# Patient Record
Sex: Male | Born: 1990 | Race: Black or African American | Hispanic: No | Marital: Single | Smoking: Current every day smoker
Health system: Southern US, Community
[De-identification: ages and names within clinical notes are randomized; demographics above are authoritative.]

---
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
## 2009-07-04 ENCOUNTER — Emergency Department (HOSPITAL_COMMUNITY): Admission: EM | Admit: 2009-07-04 | Discharge: 2009-07-04 | Payer: Self-pay | Admitting: Family Medicine

## 2010-06-08 ENCOUNTER — Emergency Department (HOSPITAL_COMMUNITY)
Admission: EM | Admit: 2010-06-08 | Discharge: 2010-06-08 | Disposition: A | Discharge status: Home / Self Care | Payer: Medicaid Other | Attending: Emergency Medicine | Admitting: Emergency Medicine

## 2010-06-08 DIAGNOSIS — L910 Hypertrophic scar: Secondary | ICD-10-CM | POA: Insufficient documentation

## 2010-06-08 DIAGNOSIS — H9209 Otalgia, unspecified ear: Secondary | ICD-10-CM | POA: Insufficient documentation

## 2010-12-28 LAB — URINALYSIS, ROUTINE W REFLEX MICROSCOPIC
Bilirubin Urine: NEGATIVE
Glucose, UA: NEGATIVE
Hgb urine dipstick: NEGATIVE
Ketones, ur: 15 — AB
Protein, ur: NEGATIVE
pH: 7

## 2010-12-28 LAB — HEPATIC FUNCTION PANEL
ALT: 28
Bilirubin, Direct: 0.3
Indirect Bilirubin: 0.8
Total Protein: 7.9

## 2010-12-28 LAB — I-STAT 8, (EC8 V) (CONVERTED LAB)
Acid-Base Excess: 1
Bicarbonate: 28.3 — ABNORMAL HIGH
Glucose, Bld: 113 — ABNORMAL HIGH
HCT: 52
Hemoglobin: 17.7 — ABNORMAL HIGH
Operator id: 151321
Potassium: 4.4
Sodium: 137
TCO2: 30

## 2010-12-28 LAB — CBC
HCT: 46.3
MCHC: 33
Platelets: 202
RDW: 13.4

## 2010-12-28 LAB — DIFFERENTIAL
Basophils Absolute: 0
Basophils Relative: 0
Eosinophils Relative: 0
Lymphocytes Relative: 6 — ABNORMAL LOW

## 2010-12-28 LAB — LIPASE, BLOOD: Lipase: 17

## 2010-12-28 LAB — INFLUENZA A+B VIRUS AG-DIRECT(RAPID): Influenza B Ag: NEGATIVE

## 2013-02-27 ENCOUNTER — Emergency Department (HOSPITAL_COMMUNITY)
Admission: EM | Admit: 2013-02-27 | Discharge: 2013-02-28 | Disposition: A | Discharge status: Home / Self Care | Payer: Self-pay | Attending: Emergency Medicine | Admitting: Emergency Medicine

## 2013-02-27 ENCOUNTER — Encounter (HOSPITAL_COMMUNITY): Payer: Self-pay | Admitting: Emergency Medicine

## 2013-02-27 DIAGNOSIS — Z79899 Other long term (current) drug therapy: Secondary | ICD-10-CM | POA: Insufficient documentation

## 2013-02-27 DIAGNOSIS — S41131A Puncture wound without foreign body of right upper arm, initial encounter: Secondary | ICD-10-CM

## 2013-02-27 DIAGNOSIS — Z792 Long term (current) use of antibiotics: Secondary | ICD-10-CM | POA: Insufficient documentation

## 2013-02-27 DIAGNOSIS — Z23 Encounter for immunization: Secondary | ICD-10-CM | POA: Insufficient documentation

## 2013-02-27 DIAGNOSIS — S41109A Unspecified open wound of unspecified upper arm, initial encounter: Secondary | ICD-10-CM | POA: Insufficient documentation

## 2013-02-27 DIAGNOSIS — F17200 Nicotine dependence, unspecified, uncomplicated: Secondary | ICD-10-CM | POA: Insufficient documentation
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
## 2013-02-28 ENCOUNTER — Emergency Department (HOSPITAL_COMMUNITY): Payer: Self-pay

## 2013-02-28 MED ORDER — LIDOCAINE-EPINEPHRINE 2 %-1:100000 IJ SOLN
10.0000 mL | Freq: Once | INTRAMUSCULAR | Status: AC
  Administered 2013-02-28: 10 mL
  Filled 2013-02-28: qty 10

## 2013-02-28 MED ORDER — CEPHALEXIN 250 MG PO CAPS
500.0000 mg | ORAL_CAPSULE | Freq: Once | ORAL | Status: AC
  Administered 2013-02-28: 500 mg via ORAL
  Filled 2013-02-28: qty 2

## 2013-02-28 MED ORDER — CEPHALEXIN 500 MG PO CAPS: 500.0000 mg | ORAL_CAPSULE | Freq: Three times a day (TID) | ORAL | Status: DC

## 2013-02-28 MED ORDER — TETANUS-DIPHTH-ACELL PERTUSSIS 5-2.5-18.5 LF-MCG/0.5 IM SUSP: 0.5000 mL | Freq: Once | INTRAMUSCULAR | Status: DC

## 2013-02-28 MED ORDER — OXYCODONE-ACETAMINOPHEN 5-325 MG PO TABS: 1.0000 | ORAL_TABLET | Freq: Once | ORAL | Status: DC

## 2013-02-28 MED ORDER — OXYCODONE-ACETAMINOPHEN 5-325 MG PO TABS
2.0000 | ORAL_TABLET | Freq: Once | ORAL | Status: AC
  Administered 2013-02-28: 2 via ORAL
  Filled 2013-02-28: qty 2

## 2013-02-28 MED ORDER — TETANUS-DIPHTH-ACELL PERTUSSIS 5-2.5-18.5 LF-MCG/0.5 IM SUSP
0.5000 mL | Freq: Once | INTRAMUSCULAR | Status: AC
  Administered 2013-02-28: 0.5 mL via INTRAMUSCULAR
  Filled 2013-02-28: qty 0.5

## 2013-02-28 NOTE — ED Notes (Signed)
EDNP at [facility] suturing.

## 2013-02-28 NOTE — ED Notes (Signed)
Pt ambulatory to br
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
## 2013-02-28 NOTE — ED Notes (Signed)
Alert, NAD, calm, waiting on xray, updated about wait.
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
## 2013-02-28 NOTE — ED Notes (Signed)
[employer] into room to speak with pt.

## 2013-03-01 NOTE — ED Provider Notes (Signed)
Medical screening examination/treatment/procedure(s) were performed by non-physician practitioner and as supervising physician I was immediately available for consultation/collaboration.    Gawain Crombie, MD 03/01/13 0026 

## 2013-03-30 ENCOUNTER — Emergency Department (HOSPITAL_COMMUNITY)
Admission: EM | Admit: 2013-03-30 | Discharge: 2013-03-30 | Disposition: A | Discharge status: Home / Self Care | Payer: PRIVATE HEALTH INSURANCE | Attending: Emergency Medicine | Admitting: Emergency Medicine

## 2013-03-30 ENCOUNTER — Encounter (HOSPITAL_COMMUNITY): Payer: Self-pay | Admitting: Emergency Medicine

## 2013-03-30 DIAGNOSIS — Z792 Long term (current) use of antibiotics: Secondary | ICD-10-CM | POA: Insufficient documentation

## 2013-03-30 DIAGNOSIS — L03319 Cellulitis of trunk, unspecified: Principal | ICD-10-CM

## 2013-03-30 DIAGNOSIS — R509 Fever, unspecified: Secondary | ICD-10-CM | POA: Insufficient documentation

## 2013-03-30 DIAGNOSIS — L02219 Cutaneous abscess of trunk, unspecified: Secondary | ICD-10-CM | POA: Insufficient documentation

## 2013-03-30 DIAGNOSIS — Z87891 Personal history of nicotine dependence: Secondary | ICD-10-CM | POA: Insufficient documentation

## 2013-03-30 DIAGNOSIS — L02214 Cutaneous abscess of groin: Secondary | ICD-10-CM

## 2013-03-30 MED ORDER — SULFAMETHOXAZOLE-TMP DS 800-160 MG PO TABS: 1.0000 | ORAL_TABLET | Freq: Two times a day (BID) | ORAL | Status: DC

## 2013-03-30 MED ORDER — HYDROCODONE-ACETAMINOPHEN 5-325 MG PO TABS: 2.0000 | ORAL_TABLET | Freq: Four times a day (QID) | ORAL | Status: DC | PRN

## 2013-03-30 MED ORDER — IBUPROFEN 200 MG PO TABS
600.0000 mg | ORAL_TABLET | Freq: Once | ORAL | Status: AC
  Administered 2013-03-30: 600 mg via ORAL
  Filled 2013-03-30: qty 3

## 2013-03-30 MED ORDER — SULFAMETHOXAZOLE-TMP DS 800-160 MG PO TABS
1.0000 | ORAL_TABLET | Freq: Once | ORAL | Status: AC
  Administered 2013-03-30: 1 via ORAL
  Filled 2013-03-30: qty 1

## 2013-03-30 NOTE — ED Notes (Signed)
Patient is alert and oriented x3.  He is complaining of a boil to the left side of his groin.   Currently he rates the pain 10 of 10.  He states that it has white drainage coming from  The area.

## 2013-03-30 NOTE — Discharge Instructions (Signed)
Abscess Care After An abscess (also called a boil or furuncle) is an infected area that contains a collection of pus. Signs and symptoms of an abscess include pain, tenderness, redness, or hardness, or you may feel a moveable soft area under your skin. An abscess can occur anywhere in the body. The infection may spread to surrounding tissues causing cellulitis. A cut (incision) by the surgeon was made over your abscess and the pus was drained out. Gauze may have been packed into the space to provide a drain that will allow the cavity to heal from the inside outwards. The boil may be painful for 5 to 7 days. Most people with a boil do not have high fevers. Your abscess, if seen early, may not have localized, and may not have been lanced. If not, another appointment may be required for this if it does not get better on its own or with medications. HOME CARE INSTRUCTIONS   Only take over-the-counter or prescription medicines for pain, discomfort, or fever as directed by your caregiver.  When you bathe, soak and then remove gauze or iodoform packs at least daily or as directed by your caregiver. You may then wash the wound gently with mild soapy water. Repack with gauze or do as your caregiver directs. SEEK IMMEDIATE MEDICAL CARE IF:   You develop increased pain, swelling, redness, drainage, or bleeding in the wound site.  You develop signs of generalized infection including muscle aches, chills, fever, or a general ill feeling.  An oral temperature above 102 F (38.9 C) develops, not controlled by medication. See your caregiver for a recheck if you develop any of the symptoms described above. If medications (antibiotics) were prescribed, take them as directed. Document Released: 09/27/2004 Document Revised: 06/03/2011 Document Reviewed: 05/25/2007 [facility] Patient Information 2014 [place], [place].  Cellulitis Cellulitis is an infection of the skin and the tissue under the skin. The infected area is  usually red and tender. This happens most often in the arms and lower legs. HOME CARE   Take your antibiotic medicine as told. Finish the medicine even if you start to feel better.  Keep the infected arm or leg raised (elevated).  Put a warm cloth on the area up to 4 times per day.  Only take medicines as told by your doctor.  Keep all doctor visits as told. GET HELP RIGHT AWAY IF:   You have a fever.  You feel very sleepy.  You throw up (vomit) or have watery poop (diarrhea).  You feel sick and have muscle aches and pains.  You see red streaks on the skin coming from the infected area.  Your red area gets bigger or turns a dark color.  Your bone or joint under the infected area is painful after the skin heals.  Your infection comes back in the same area or different area.  You have a puffy (swollen) bump in the infected area.  You have new symptoms. MAKE SURE YOU:   Understand these instructions.  Will watch your condition.  Will get help right away if you are not doing well or get worse. Document Released: 08/28/2007 Document Revised: 09/10/2011 Document Reviewed: 05/27/2011 [facility] Patient Information 2014 [place], [place]. Please take the antibiotic as directed.  Remove the packing in 2 days, and allow the skin to heal.  U. been given a prescription for pain control as well.  Return if he develops new or worsening symptoms.  Generalized body aches, or fever

## 2013-03-30 NOTE — ED Provider Notes (Signed)
CSN: [id number]     Arrival date & time 03/30/13  2025 History  This chart was scribed for non-physician practitioner, Earley Favor, FNP,working with Shanna Cisco, MD, by Karle Plumber, ED Scribe.  This patient was seen in room WTR7/WTR7 and the patient's care was started at 9:19 PM.  Chief Complaint  Patient presents with  . Recurrent Skin Infections   The history is provided by the patient. No language interpreter was used.   HPI Comments:  Thomas [name] is a 23 y.o. male who presents to the Emergency Department complaining of an abscess to his left groin that started approximately two days ago. He reports drainage and associated fever. Pt states the pain has been increasing. He reports taking Tylenol yesterday for the pain with mild relief.   History reviewed. No pertinent past medical history. History reviewed. No pertinent past surgical history. History reviewed. No pertinent family history. History  Substance Use Topics  . Smoking status: Former Games developer  . Smokeless tobacco: Not on file  . Alcohol Use: No    Review of Systems  Constitutional: Positive for fever.  Genitourinary: Negative for dysuria, penile swelling, scrotal swelling, penile pain and testicular pain.  Skin: Positive for wound.       Abscess to left groin  All other systems reviewed and are negative.    Allergies  Review of patient's allergies indicates no known allergies.  Home Medications   Current Outpatient Rx  Name  Route  Sig  Dispense  Refill  . acetaminophen (TYLENOL) 325 MG tablet   Oral   Take 650 mg by mouth every 6 (six) hours as needed (PAIN).         Marland Kitchen HYDROcodone-acetaminophen (NORCO/VICODIN) 5-325 MG per tablet   Oral   Take 2 tablets by mouth every 6 (six) hours as needed.   10 tablet   0   . sulfamethoxazole-trimethoprim (BACTRIM DS) 800-160 MG per tablet   Oral   Take 1 tablet by mouth 2 (two) times daily.   9 tablet   0    Triage Vitals: BP 141/93  Pulse 99   Temp(Src) 100.1 F (37.8 C) (Oral)  Resp 16  SpO2 96% Physical Exam  Nursing note and vitals reviewed. Constitutional: He is oriented to person, place, and time. He appears well-developed and well-nourished.  HENT:  Head: Normocephalic and atraumatic.  Eyes: EOM are normal.  Neck: Normal range of motion.  Cardiovascular: Normal rate.   Pulmonary/Chest: Effort normal.  Abdominal: Soft. He exhibits no distension. There is no tenderness.  Musculoskeletal: Normal range of motion.  Neurological: He is alert and oriented to person, place, and time.  Skin: Skin is warm and dry. No erythema.  5 x 4 cm abscess lateral side of mons. No induration. Does not extend to thigh.   Psychiatric: He has a normal mood and affect. His behavior is normal.    ED Course  Procedures (including critical care time) DIAGNOSTIC STUDIES: Oxygen Saturation is 96% on RA, normal by my interpretation.   COORDINATION OF CARE: 9:23 PM- Will I & D abscess. Will prescribe oral antibiotics and pain medications. Pt verbalizes understanding and agrees to plan.  INCISION AND DRAINAGE Performed by: Earley Favor, FNP Consent: Verbal consent obtained. Risks and benefits: risks, benefits and alternatives were discussed Type: abscess  Body area: left side of mons  Anesthesia: local infiltration  Incision was made with a scalpel.  Local anesthetic: lidocaine 1% with epinephrine  Anesthetic total: 5 ml  Complexity: complex Blunt  dissection to break up loculations  Drainage: purulent  Drainage amount: scant   Packing material: 1/4 in iodoform gauze  Patient tolerance: Patient tolerated the procedure well with no immediate complications.   Medications  sulfamethoxazole-trimethoprim (BACTRIM DS) 800-160 MG per tablet 1 tablet (not administered)  ibuprofen (ADVIL,MOTRIN) tablet 600 mg (not administered)    Labs Review Labs Reviewed - No data to display Imaging Review No results found.  EKG  Interpretation   None       MDM   1. Abscess of groin, left      I personally performed the services described in this documentation, which was scribed in my presence. The recorded information has been reviewed and is accurate.    Arman FilterGail K Suzie Vandam, NP 03/30/13 2149

## 2013-03-30 NOTE — ED Provider Notes (Signed)
Medical screening examination/treatment/procedure(s) were performed by non-physician practitioner and as supervising physician I was immediately available for consultation/collaboration.  Shanna CiscoMegan E Rosalee Tolley, MD 03/30/13 [phone]

## 2014-10-06 ENCOUNTER — Encounter (HOSPITAL_COMMUNITY): Payer: Self-pay | Admitting: Emergency Medicine

## 2014-10-06 ENCOUNTER — Emergency Department (HOSPITAL_COMMUNITY)
Admission: EM | Admit: 2014-10-06 | Discharge: 2014-10-06 | Disposition: A | Discharge status: Home / Self Care | Payer: PRIVATE HEALTH INSURANCE | Attending: Emergency Medicine | Admitting: Emergency Medicine

## 2014-10-06 DIAGNOSIS — S46812A Strain of other muscles, fascia and tendons at shoulder and upper arm level, left arm, initial encounter: Secondary | ICD-10-CM | POA: Insufficient documentation

## 2014-10-06 DIAGNOSIS — Y999 Unspecified external cause status: Secondary | ICD-10-CM | POA: Insufficient documentation

## 2014-10-06 DIAGNOSIS — M62830 Muscle spasm of back: Secondary | ICD-10-CM | POA: Insufficient documentation

## 2014-10-06 DIAGNOSIS — M25512 Pain in left shoulder: Secondary | ICD-10-CM

## 2014-10-06 DIAGNOSIS — Y92410 Unspecified street and highway as the place of occurrence of the external cause: Secondary | ICD-10-CM | POA: Insufficient documentation

## 2014-10-06 DIAGNOSIS — S299XXA Unspecified injury of thorax, initial encounter: Secondary | ICD-10-CM | POA: Insufficient documentation

## 2014-10-06 DIAGNOSIS — Y9389 Activity, other specified: Secondary | ICD-10-CM | POA: Insufficient documentation

## 2014-10-06 DIAGNOSIS — H9311 Tinnitus, right ear: Secondary | ICD-10-CM | POA: Insufficient documentation

## 2014-10-06 DIAGNOSIS — Z87891 Personal history of nicotine dependence: Secondary | ICD-10-CM | POA: Insufficient documentation

## 2014-10-06 DIAGNOSIS — M546 Pain in thoracic spine: Secondary | ICD-10-CM

## 2014-10-06 MED ORDER — NAPROXEN 500 MG PO TABS: 500.0000 mg | ORAL_TABLET | Freq: Two times a day (BID) | ORAL | Status: DC | PRN

## 2014-10-06 MED ORDER — CYCLOBENZAPRINE HCL 10 MG PO TABS: 10.0000 mg | ORAL_TABLET | Freq: Three times a day (TID) | ORAL | Status: DC | PRN
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
## 2015-06-19 ENCOUNTER — Emergency Department (HOSPITAL_COMMUNITY)
Admission: EM | Admit: 2015-06-19 | Discharge: 2015-06-19 | Disposition: A | Discharge status: Home / Self Care | Payer: No Typology Code available for payment source | Attending: Emergency Medicine | Admitting: Emergency Medicine

## 2015-06-19 ENCOUNTER — Encounter (HOSPITAL_COMMUNITY): Payer: Self-pay | Admitting: Emergency Medicine

## 2015-06-19 DIAGNOSIS — M545 Low back pain: Secondary | ICD-10-CM | POA: Insufficient documentation

## 2015-06-19 DIAGNOSIS — Z87828 Personal history of other (healed) physical injury and trauma: Secondary | ICD-10-CM | POA: Insufficient documentation

## 2015-06-19 DIAGNOSIS — M546 Pain in thoracic spine: Secondary | ICD-10-CM | POA: Insufficient documentation

## 2015-06-19 DIAGNOSIS — M549 Dorsalgia, unspecified: Secondary | ICD-10-CM

## 2015-06-19 DIAGNOSIS — Z87891 Personal history of nicotine dependence: Secondary | ICD-10-CM | POA: Insufficient documentation

## 2015-06-19 DIAGNOSIS — G8929 Other chronic pain: Secondary | ICD-10-CM | POA: Insufficient documentation

## 2015-06-19 MED ORDER — DEXAMETHASONE SODIUM PHOSPHATE 10 MG/ML IJ SOLN
10.0000 mg | Freq: Once | INTRAMUSCULAR | Status: AC
  Administered 2015-06-19: 10 mg via INTRAMUSCULAR
  Filled 2015-06-19: qty 1

## 2015-06-19 MED ORDER — KETOROLAC TROMETHAMINE 60 MG/2ML IM SOLN
60.0000 mg | Freq: Once | INTRAMUSCULAR | Status: AC
  Administered 2015-06-19: 60 mg via INTRAMUSCULAR
  Filled 2015-06-19: qty 2

## 2015-06-19 MED ORDER — CYCLOBENZAPRINE HCL 10 MG PO TABS: 10.0000 mg | ORAL_TABLET | Freq: Two times a day (BID) | ORAL | Status: DC | PRN

## 2015-06-19 MED ORDER — CYCLOBENZAPRINE HCL 10 MG PO TABS
10.0000 mg | ORAL_TABLET | Freq: Once | ORAL | Status: AC
  Administered 2015-06-19: 10 mg via ORAL
  Filled 2015-06-19: qty 1

## 2015-06-19 MED ORDER — TRAMADOL HCL 50 MG PO TABS: 50.0000 mg | ORAL_TABLET | Freq: Two times a day (BID) | ORAL | Status: DC | PRN

## 2015-06-19 MED ORDER — IBUPROFEN 800 MG PO TABS: 800.0000 mg | ORAL_TABLET | Freq: Three times a day (TID) | ORAL | Status: DC
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
## 2015-06-19 NOTE — Discharge Instructions (Signed)
Back Pain, Adult °Back pain is very common in adults. The cause of back pain is rarely dangerous and the pain often gets better over time. The cause of your back pain may not be known. Some common causes of back pain include: °· Strain of the muscles or ligaments supporting the spine. °· Wear and tear (degeneration) of the spinal disks. °· Arthritis. °· Direct injury to the back. °For many people, back pain may return. Since back pain is rarely dangerous, most people can learn to manage this condition on their own. °HOME CARE INSTRUCTIONS °Watch your back pain for any changes. The following actions may help to lessen any discomfort you are feeling: °· Remain active. It is stressful on your back to sit or stand in one place for long periods of time. Do not sit, drive, or stand in one place for more than 30 minutes at a time. Take short walks on even surfaces as soon as you are able. Try to increase the length of time you walk each day. °· Exercise regularly as directed by your health care provider. Exercise helps your back heal faster. It also helps avoid future injury by keeping your muscles strong and flexible. °· Do not stay in bed. Resting more than 1-2 days can delay your recovery. °· Pay attention to your body when you bend and lift. The most comfortable positions are those that put less stress on your recovering back. Always use proper lifting techniques, including: °· Bending your knees. °· Keeping the load close to your body. °· Avoiding twisting. °· Find a comfortable position to sleep. Use a firm mattress and lie on your side with your knees slightly bent. If you lie on your back, put a pillow under your knees. °· Avoid feeling anxious or stressed. Stress increases muscle tension and can worsen back pain. It is important to recognize when you are anxious or stressed and learn ways to manage it, such as with exercise. °· Take medicines only as directed by your health care provider. Over-the-counter  medicines to reduce pain and inflammation are often the most helpful. Your health care provider may prescribe muscle relaxant drugs. These medicines help dull your pain so you can more quickly return to your normal activities and healthy exercise. °· Apply ice to the injured area: °· Put ice in a plastic bag. °· Place a towel between your skin and the bag. °· Leave the ice on for 20 minutes, 2-3 times a day for the first 2-3 days. After that, ice and heat may be alternated to reduce pain and spasms. °· Maintain a healthy weight. Excess weight puts extra stress on your back and makes it difficult to maintain good posture. °SEEK MEDICAL CARE IF: °· You have pain that is not relieved with rest or medicine. °· You have increasing pain going down into the legs or buttocks. °· You have pain that does not improve in one week. °· You have night pain. °· You lose weight. °· You have a fever or chills. °SEEK IMMEDIATE MEDICAL CARE IF:  °· You develop new bowel or bladder control problems. °· You have unusual weakness or numbness in your arms or legs. °· You develop nausea or vomiting. °· You develop abdominal pain. °· You feel faint. °  °This information is not intended to replace advice given to you by your health care provider. Make sure you discuss any questions you have with your health care provider. °  °Document Released: 03/11/2005 Document Revised: 04/01/2014 Document Reviewed: 07/13/2013 °Elsevier Interactive Patient Education ©2016 [employer] ° °Chronic Back Pain ° When back pain lasts longer than 3 months, it is called chronic back pain. People with chronic back pain often go through certain periods that are more intense (flare-ups).  °CAUSES °Chronic back pain can be caused by wear and tear (degeneration) on different structures in your back. These structures include: °· The bones of your spine (vertebrae) and the joints surrounding your spinal cord and nerve roots (facets). °· The strong, fibrous tissues that  connect your vertebrae (ligaments). °Degeneration of these structures may result in pressure on your nerves. This can lead to constant pain. °HOME CARE INSTRUCTIONS °· Avoid bending, heavy lifting, prolonged sitting, and activities which make the problem worse. °· Take brief periods of rest throughout the day to reduce your pain. Lying down or standing usually is better than sitting while you are resting. °· Take over-the-counter or prescription medicines only as directed by your caregiver. °SEEK IMMEDIATE MEDICAL CARE IF:  °· You have weakness or numbness in one of your legs or feet. °· You have trouble controlling your bladder or bowels. °· You have nausea, vomiting, abdominal pain, shortness of breath, or fainting. °  °This information is not intended to replace advice given to you by your health care provider. Make sure you discuss any questions you have with your health care provider. °  °Document Released: 04/18/2004 Document Revised: 06/03/2011 Document Reviewed: 08/29/2014 °Elsevier Interactive Patient Education ©2016 [employer] Inc. ° °
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
## 2015-06-19 NOTE — ED Notes (Signed)
Bed: WA26 Expected date:  Expected time:  Means of arrival:  Comments: 

## 2015-09-04 ENCOUNTER — Emergency Department (HOSPITAL_COMMUNITY)
Admission: EM | Admit: 2015-09-04 | Discharge: 2015-09-04 | Disposition: A | Discharge status: Home / Self Care | Payer: PRIVATE HEALTH INSURANCE | Attending: Emergency Medicine | Admitting: Emergency Medicine

## 2015-09-04 ENCOUNTER — Encounter (HOSPITAL_COMMUNITY): Payer: Self-pay | Admitting: *Deleted

## 2015-09-04 DIAGNOSIS — N342 Other urethritis: Secondary | ICD-10-CM | POA: Diagnosis not present

## 2015-09-04 DIAGNOSIS — Z87891 Personal history of nicotine dependence: Secondary | ICD-10-CM | POA: Insufficient documentation

## 2015-09-04 DIAGNOSIS — A64 Unspecified sexually transmitted disease: Secondary | ICD-10-CM | POA: Diagnosis present

## 2015-09-04 LAB — URINALYSIS, ROUTINE W REFLEX MICROSCOPIC
BILIRUBIN URINE: NEGATIVE
GLUCOSE, UA: NEGATIVE mg/dL
HGB URINE DIPSTICK: NEGATIVE
Ketones, ur: 15 mg/dL — AB
Nitrite: NEGATIVE
PROTEIN: NEGATIVE mg/dL
SPECIFIC GRAVITY, URINE: 1.026 (ref 1.005–1.030)
pH: 6.5 (ref 5.0–8.0)

## 2015-09-04 LAB — URINE MICROSCOPIC-ADD ON: RBC / HPF: NONE SEEN RBC/hpf (ref 0–5)

## 2015-09-04 MED ORDER — CIPROFLOXACIN HCL 500 MG PO TABS: 500.0000 mg | ORAL_TABLET | Freq: Two times a day (BID) | ORAL | Status: DC

## 2015-09-04 MED ORDER — STERILE WATER FOR INJECTION IJ SOLN
INTRAMUSCULAR | Status: AC
  Administered 2015-09-04: 10 mL
  Filled 2015-09-04: qty 10

## 2015-09-04 MED ORDER — AZITHROMYCIN 250 MG PO TABS
1000.0000 mg | ORAL_TABLET | Freq: Once | ORAL | Status: AC
Start: 2015-09-04 — End: 2015-09-04
  Administered 2015-09-04: 1000 mg via ORAL
  Filled 2015-09-04: qty 4

## 2015-09-04 MED ORDER — AZITHROMYCIN 1 G PO PACK
1.0000 g | PACK | Freq: Once | ORAL | Status: DC
  Filled 2015-09-04: qty 1

## 2015-09-04 MED ORDER — CEFTRIAXONE SODIUM 250 MG IJ SOLR
250.0000 mg | Freq: Once | INTRAMUSCULAR | Status: AC
  Administered 2015-09-04: 250 mg via INTRAMUSCULAR
  Filled 2015-09-04: qty 250

## 2015-09-04 NOTE — ED Provider Notes (Signed)
History  By signing my name below, I, Earmon Phoenix, attest that this documentation has been prepared under the direction and in the presence of Teressa Lower, FNP. Electronically Signed: Earmon Phoenix, ED Scribe. 09/04/2015. 11:23 AM.  Chief Complaint  Patient presents with  . SEXUALLY TRANSMITTED DISEASE   The history is provided by the patient and medical records. No language interpreter was used.    HPI Comments:  Thomas [name] is a 25 y.o. male who presents to the Emergency Department complaining of white penile discharge that began 3-4 days ago. He reports having an STD in the past about two years ago.  He reports associated dysuria. He reports sexual activity without protection. He denies modifying factors of the symptoms. He denies abdominal pain, nausea, vomiting, fever, chills, penile or testicular swelling.  History reviewed. No pertinent past medical history. History reviewed. No pertinent past surgical history. History reviewed. No pertinent family history. Social History  Substance Use Topics  . Smoking status: Former Games developer  . Smokeless tobacco: None  . Alcohol Use: No    Review of Systems  Constitutional: Negative for fever and chills.  Gastrointestinal: Negative for nausea and vomiting.  Genitourinary: Positive for dysuria and discharge. Negative for penile swelling, penile pain and testicular pain.  All other systems reviewed and are negative.   Allergies  Review of patient's allergies indicates no known allergies.  Home Medications   Prior to Admission medications   Medication Sig Start Date End Date Taking? Authorizing Provider  acetaminophen (TYLENOL) 325 MG tablet Take 650 mg by mouth every 6 (six) hours as needed (PAIN).    Historical Provider, MD  cyclobenzaprine (FLEXERIL) 10 MG tablet Take 1 tablet (10 mg total) by mouth 2 (two) times daily as needed for muscle spasms. 06/19/15   Danelle Berry, PA-C  ibuprofen (ADVIL,MOTRIN) 800 MG tablet Take  1 tablet (800 mg total) by mouth 3 (three) times daily. 06/19/15   Danelle Berry, PA-C  naproxen (NAPROSYN) 500 MG tablet Take 1 tablet (500 mg total) by mouth 2 (two) times daily as needed for mild pain, moderate pain or headache (TAKE WITH MEALS.). 10/06/14   Mercedes Camprubi-Soms, PA-C  traMADol (ULTRAM) 50 MG tablet Take 1 tablet (50 mg total) by mouth every 12 (twelve) hours as needed for severe pain. 06/19/15   Danelle Berry, PA-C   Triage Vitals: BP 130/88 mmHg  Pulse 71  Temp(Src) 99 F (37.2 C) (Oral)  Resp 16  SpO2 100% Physical Exam  Constitutional: He is oriented to person, place, and time. He appears well-developed and well-nourished.  HENT:  Head: Normocephalic and atraumatic.  Eyes: EOM are normal.  Neck: Normal range of motion.  Cardiovascular: Normal rate.   Pulmonary/Chest: Effort normal.  Genitourinary: No penile tenderness.  No rash noted. Discharge noted  Musculoskeletal: Normal range of motion.  Neurological: He is alert and oriented to person, place, and time.  Skin: Skin is warm and dry.  Psychiatric: He has a normal mood and affect. His behavior is normal.  Nursing note and vitals reviewed.   ED Course  Procedures (including critical care time) DIAGNOSTIC STUDIES: Oxygen Saturation is 100% on RA, normal by my interpretation.   COORDINATION OF CARE: 11:18 AM- Will order STD panel and check and treat for GC/chlamydia. Pt verbalizes understanding and agrees to plan.  Medications - No data to display  Labs Review Labs Reviewed  URINALYSIS, ROUTINE W REFLEX MICROSCOPIC (NOT AT [facility])  HIV ANTIBODY (ROUTINE TESTING)  RPR  GC/CHLAMYDIA PROBE AMP ([facility]) NOT [facility]    Imaging Review No results found. I have personally reviewed and evaluated these images and lab results as part of my medical decision-making.   EKG Interpretation None      MDM   Final diagnoses:  Urethritis    Pt discharged after treatment. Educated on safe sex practices  I  personally performed the services described in this documentation, which was scribed in my presence. The recorded information has been reviewed and is accurate.     Teressa LowerVrinda Nikalas Bramel, NP 09/04/15 1154  Mancel BaleElliott Wentz, MD 09/04/15 [phone]

## 2015-09-04 NOTE — ED Notes (Signed)
Declined W/C at D/C and was escorted to lobby by RN. 

## 2015-09-04 NOTE — Discharge Instructions (Signed)
Urethritis, Adult °Urethritis is an inflammation of the tube through which urine exits your bladder (urethra).  °CAUSES °Urethritis is often caused by an infection in your urethra. The infection can be viral, like herpes. The infection can also be bacterial, like gonorrhea. °RISK FACTORS °Risk factors of urethritis include: °· Having sex without using a condom. °· Having multiple sexual partners. °· Having poor hygiene. °SIGNS AND SYMPTOMS °Symptoms of urethritis are less noticeable in women than in men. These symptoms include: °· Burning feeling when you urinate (dysuria). °· Discharge from your urethra. °· Blood in your urine (hematuria). °· Urinating more than usual. °DIAGNOSIS  °To confirm a diagnosis of urethritis, your health care provider will do the following: °· Ask about your sexual history. °· Perform a physical exam. °· Have you provide a sample of your urine for lab testing. °· Use a cotton swab to gently collect a sample from your urethra for lab testing. °TREATMENT  °It is important to treat urethritis. Depending on the cause, untreated urethritis may lead to serious genital infections and possibly infertility. Urethritis caused by a bacterial infection is treated with antibiotic medicine. All sexual partners must be treated.  °HOME CARE INSTRUCTIONS °· Do not have sex until the test results are known and treatment is completed, even if your symptoms go away before you finish treatment. °· If you were prescribed an antibiotic, finish it all even if you start to feel better. °SEEK MEDICAL CARE IF:  °· Your symptoms are not improved in 3 days. °· Your symptoms are getting worse. °· You develop abdominal pain or pelvic pain (in women). °· You develop joint pain. °· You have a fever. °SEEK IMMEDIATE MEDICAL CARE IF:  °· You have severe pain in the belly, back, or side. °· You have repeated vomiting. °MAKE SURE YOU: °· Understand these instructions. °· Will watch your condition. °· Will get help right away  if you are not doing well or get worse. °  °This information is not intended to replace advice given to you by your health care provider. Make sure you discuss any questions you have with your health care provider. °  °Document Released: 09/04/2000 Document Revised: 07/26/2014 Document Reviewed: 11/09/2012 °Elsevier Interactive Patient Education ©2016 [employer] Inc. ° °

## 2015-09-04 NOTE — ED Notes (Signed)
Pt reports drainage from penis . ?

## 2015-09-05 LAB — RPR: RPR: NONREACTIVE

## 2015-09-05 LAB — HIV ANTIBODY (ROUTINE TESTING W REFLEX): HIV SCREEN 4TH GENERATION: NONREACTIVE

## 2015-09-05 LAB — GC/CHLAMYDIA PROBE AMP (~~LOC~~) NOT AT ARMC
CHLAMYDIA, DNA PROBE: NEGATIVE
NEISSERIA GONORRHEA: NEGATIVE

## 2016-05-20 ENCOUNTER — Encounter (HOSPITAL_COMMUNITY): Payer: Self-pay

## 2016-05-20 ENCOUNTER — Emergency Department (HOSPITAL_COMMUNITY)
Admission: EM | Admit: 2016-05-20 | Discharge: 2016-05-20 | Disposition: A | Discharge status: Home / Self Care | Payer: PRIVATE HEALTH INSURANCE | Attending: Emergency Medicine | Admitting: Emergency Medicine

## 2016-05-20 DIAGNOSIS — Z87891 Personal history of nicotine dependence: Secondary | ICD-10-CM | POA: Insufficient documentation

## 2016-05-20 DIAGNOSIS — R369 Urethral discharge, unspecified: Secondary | ICD-10-CM

## 2016-05-20 MED ORDER — LIDOCAINE HCL (PF) 1 % IJ SOLN
INTRAMUSCULAR | Status: AC
  Administered 2016-05-20: 5 mL
  Filled 2016-05-20: qty 5

## 2016-05-20 MED ORDER — CEFTRIAXONE SODIUM 250 MG IJ SOLR
250.0000 mg | Freq: Once | INTRAMUSCULAR | Status: AC
  Administered 2016-05-20: 250 mg via INTRAMUSCULAR
  Filled 2016-05-20: qty 250

## 2016-05-20 MED ORDER — AZITHROMYCIN 250 MG PO TABS
1000.0000 mg | ORAL_TABLET | Freq: Once | ORAL | Status: AC
  Administered 2016-05-20: 1000 mg via ORAL
  Filled 2016-05-20: qty 4

## 2016-05-20 NOTE — ED Provider Notes (Signed)
[facility] Provider Note   CSN: [id number] Arrival date & time: 05/20/16  1140  By signing my name below, I, Marnette Burgess Long, attest that this documentation has been prepared under the direction and in the presence of Cathren Laine, MD . Electronically Signed: Marnette Burgess Long, Scribe. 05/20/2016. 1:09 PM.    History   Chief Complaint No chief complaint on file.   The history is provided by the patient. No language interpreter was used.    HPI Comments:  Thomas [name] is a 26 y.o. male who presents to the Emergency Department complaining of mild, white, penile discharge with moderate penile pain onset this morning. He reports waking up this morning and noticing the moderate penile pain with discharge from penile meatus. He has associated symptoms of dysuria. He reports he is sexually active with one partner but has not had intercourse in over a month. He did not take anything PTA for relief of his symptoms. Direct pressure and palpation exacerbate his pain. Pt denies abdominal pain, vomiting, fever, CP, and any other associated symptoms at this time.   History reviewed. No pertinent past medical history.  There are no active problems to display for this patient.   History reviewed. No pertinent surgical history.     Home Medications    Prior to Admission medications   Medication Sig Start Date End Date Taking? Authorizing Provider  acetaminophen (TYLENOL) 325 MG tablet Take 650 mg by mouth every 6 (six) hours as needed (PAIN).    Historical Provider, MD  ciprofloxacin (CIPRO) 500 MG tablet Take 1 tablet (500 mg total) by mouth 2 (two) times daily. 09/04/15   Teressa Lower, NP  cyclobenzaprine (FLEXERIL) 10 MG tablet Take 1 tablet (10 mg total) by mouth 2 (two) times daily as needed for muscle spasms. 06/19/15   Danelle Berry, PA-C  ibuprofen (ADVIL,MOTRIN) 800 MG tablet Take 1 tablet (800 mg total) by mouth 3 (three) times daily. 06/19/15   Danelle Berry, PA-C  naproxen  (NAPROSYN) 500 MG tablet Take 1 tablet (500 mg total) by mouth 2 (two) times daily as needed for mild pain, moderate pain or headache (TAKE WITH MEALS.). 10/06/14   Mercedes Street, PA-C  traMADol (ULTRAM) 50 MG tablet Take 1 tablet (50 mg total) by mouth every 12 (twelve) hours as needed for severe pain. 06/19/15   Danelle Berry, PA-C    Family History No family history on file.  Social History Social History  Substance Use Topics  . Smoking status: Former Games developer  . Smokeless tobacco: Not on file  . Alcohol use No     Allergies   Patient has no known allergies.   Review of Systems Review of Systems  Constitutional: Negative for fever.  Cardiovascular: Negative for chest pain.  Gastrointestinal: Negative for abdominal pain and vomiting.  Genitourinary: Positive for discharge, dysuria and penile pain.     Physical Exam Updated Vital Signs BP 158/100   Pulse 74   Temp 98.1 F (36.7 C)   Resp 18   SpO2 100%   Physical Exam  Constitutional: He appears well-developed and well-nourished.  HENT:  Head: Normocephalic.  Eyes: Conjunctivae are normal.  Cardiovascular: Normal rate.   Pulmonary/Chest: Effort normal.  Abdominal: He exhibits no distension.  Genitourinary:  Genitourinary Comments: Mild whitish penile discharge   Musculoskeletal: Normal range of motion.  Neurological: He is alert.  Skin: Skin is warm and dry.  Psychiatric: He has a normal mood and affect.  Nursing note and vitals reviewed.  ED Treatments / Results  DIAGNOSTIC STUDIES:  Oxygen Saturation is 100% on RA, normal by my interpretation.    COORDINATION OF CARE:  12:59 PM Discussed treatment plan with pt at bedside including UA and penile swab and pt agreed to plan.  Labs (all labs ordered are listed, but only abnormal results are displayed) Labs Reviewed - No data to display  EKG  EKG Interpretation None       Radiology No results found.  Procedures Procedures (including  critical care time)  Medications Ordered in ED Medications - No data to display   Initial Impression / Assessment and Plan / ED Course  I have reviewed the triage vital signs and the nursing notes.  Pertinent labs & imaging results that were available during my care of the patient were reviewed by me and considered in my medical decision making (see chart for details).  Confirmed nkda w pt.   Urethral swab sent.  Rocephin and zithromax rx given.    Final Clinical Impressions(s) / ED Diagnoses   Final diagnoses:  None    New Prescriptions New Prescriptions   No medications on file   I personally performed the services described in this documentation, which was scribed in my presence. The recorded information has been reviewed and considered. Cathren LaineKevin Roland Prine, MD     Cathren LaineKevin Yedidya Duddy, MD 05/20/16 [phone]

## 2016-05-20 NOTE — Discharge Instructions (Signed)
It was our pleasure to provide your ER care today - we hope that you feel better.  No sex until after symptoms have resolved, then use condoms for safer sex.   Have any sexual contacts checked by their doctor or county health department std clinic.   Return to ER if worse, new symptoms, fevers, other concern.

## 2016-05-20 NOTE — ED Triage Notes (Signed)
Patient complains of penile pain and discharge x 1 day

## 2016-05-21 LAB — GC/CHLAMYDIA PROBE AMP (~~LOC~~) NOT AT ARMC
Chlamydia: NEGATIVE
Neisseria Gonorrhea: POSITIVE — AB

## 2016-09-28 ENCOUNTER — Encounter (HOSPITAL_COMMUNITY): Payer: Self-pay

## 2016-09-28 ENCOUNTER — Emergency Department (HOSPITAL_COMMUNITY)
Admission: EM | Admit: 2016-09-28 | Discharge: 2016-09-28 | Disposition: A | Discharge status: Home / Self Care | Payer: PRIVATE HEALTH INSURANCE | Attending: Emergency Medicine | Admitting: Emergency Medicine

## 2016-09-28 DIAGNOSIS — Z79899 Other long term (current) drug therapy: Secondary | ICD-10-CM | POA: Insufficient documentation

## 2016-09-28 DIAGNOSIS — Z791 Long term (current) use of non-steroidal anti-inflammatories (NSAID): Secondary | ICD-10-CM | POA: Insufficient documentation

## 2016-09-28 DIAGNOSIS — Z87891 Personal history of nicotine dependence: Secondary | ICD-10-CM | POA: Insufficient documentation

## 2016-09-28 DIAGNOSIS — M62830 Muscle spasm of back: Secondary | ICD-10-CM | POA: Insufficient documentation

## 2016-09-28 MED ORDER — CYCLOBENZAPRINE HCL 10 MG PO TABS: 10.0000 mg | ORAL_TABLET | Freq: Every evening | ORAL | 0 refills | Status: DC | PRN

## 2016-09-28 NOTE — ED Notes (Signed)
Pt woke this am with mid to lower back pain. States was unable to go to work. States has had "back problems" on and off since he worked for [employer]. Ambulates w/o difficulty.

## 2016-09-28 NOTE — Discharge Instructions (Signed)
Please read instructions below.  Talk with your PCP about any new medications.  You can take advil or aleve as needed for pain. You can take flexeril at bedtime for muscle spasm. Do not drive or drink alcohol while taking this medication. Schedule an appointment with the orthopedic specialist for follow-up on your ongoing pain.  Return to ER if new numbness or tingling in your arms or legs, inability to urinate, inability to hold your bowels, or weakness in your extremities.

## 2016-09-28 NOTE — ED Notes (Signed)
Pt reports he was supposed to be at work this morning but couldn't get out of the bed due to his chronic back pain that he has been "having for years" and states he wants to know what is wrong with his back. Ambulatory, NAD.

## 2016-09-28 NOTE — ED Provider Notes (Signed)
[facility] Provider Note   CSN: [id number] Arrival date & time: 09/28/16  1131  By signing my name below, I, Deland Pretty, attest that this documentation has been prepared under the direction and in the presence of [place] Russo, PA-C Electronically Signed: Deland Pretty, ED Scribe. 09/28/16. 1:58 PM.  History   Chief Complaint Chief Complaint  Patient presents with  . Back Pain   The history is provided by the patient. No language interpreter was used.   HPI Comments: Thomas [name] is a 26 y.o. male who presents to the Emergency Department complaining of chronic mid back pain that worsened this morning. Patient states he attempted to get out of bed this morning, and was having significant pain in his mid back. No new injuries. Denies modifying factors, has not tried ice or heat, or pain medication. He states that 6 years ago he tried to lift a heavy TV causing the onset of his symptoms. He has not seen an orthopedist or a PCP for his pain. He reports his job job requires him to do heavy lifting, which aggravates his symptoms. Denies history of IV drug use, cancer, fever, urinary symptoms, bowel or bladder incontinence, numbness or tingling down extremities.   History reviewed. No pertinent past medical history.  There are no active problems to display for this patient.   History reviewed. No pertinent surgical history.     Home Medications    Prior to Admission medications   Medication Sig Start Date End Date Taking? Authorizing Provider  acetaminophen (TYLENOL) 325 MG tablet Take 650 mg by mouth every 6 (six) hours as needed (PAIN).    [provider]  ciprofloxacin (CIPRO) 500 MG tablet Take 1 tablet (500 mg total) by mouth 2 (two) times daily. 09/04/15   Teressa Lower, NP  cyclobenzaprine (FLEXERIL) 10 MG tablet Take 1 tablet (10 mg total) by mouth at bedtime as needed for muscle spasms. 09/28/16   Russo, [place] N, PA-C  ibuprofen (ADVIL,MOTRIN) 800 MG  tablet Take 1 tablet (800 mg total) by mouth 3 (three) times daily. 06/19/15   Danelle Berry, PA-C  naproxen (NAPROSYN) 500 MG tablet Take 1 tablet (500 mg total) by mouth 2 (two) times daily as needed for mild pain, moderate pain or headache (TAKE WITH MEALS.). 10/06/14   Street, [place], PA-C  traMADol (ULTRAM) 50 MG tablet Take 1 tablet (50 mg total) by mouth every 12 (twelve) hours as needed for severe pain. 06/19/15   Danelle Berry, PA-C    Family History No family history on file.  Social History Social History  Substance Use Topics  . Smoking status: Former Games developer  . Smokeless tobacco: Never Used  . Alcohol use No     Allergies   Patient has no known allergies.   Review of Systems Review of Systems  Constitutional: Negative for fever.  Gastrointestinal:       No bowel incontinence.  Genitourinary: Negative for difficulty urinating, dysuria and frequency.  Musculoskeletal: Positive for back pain.  Neurological: Negative for numbness.     Physical Exam Updated Vital Signs BP (!) 143/97 (BP Location: Left Arm)   Pulse 64   Temp 98.5 F (36.9 C) (Oral)   Resp 18   SpO2 100%   Physical Exam  Constitutional: He appears well-developed and well-nourished. No distress.  HENT:  Head: Normocephalic and atraumatic.  Eyes: Conjunctivae are normal.  Neck: Normal range of motion.  Cardiovascular: Normal rate.   Pulmonary/Chest: Effort normal.  Musculoskeletal: He exhibits tenderness.  Bilateral thoracic paraspinal musculature tenderness. No spinal tenderness. No bony step-offs. No gross deformities. Moving all extremities.  Neurological: He displays normal reflexes. He exhibits normal muscle tone. Coordination normal.  5/5 Strength bilateral upper and lower extremities. Normal sensation. Normal gait.   Psychiatric: He has a normal mood and affect. His behavior is normal.  Nursing note and vitals reviewed.    ED Treatments / Results   DIAGNOSTIC STUDIES: Oxygen  Saturation is 100% on RA, normal by my interpretation.   COORDINATION OF CARE: 1:45 PM-Discussed next steps with pt. Pt verbalized understanding and is agreeable with the plan.   Labs (all labs ordered are listed, but only abnormal results are displayed) Labs Reviewed - No data to display  EKG  EKG Interpretation None       Radiology No results found.  Procedures Procedures (including critical care time)  Medications Ordered in ED Medications - No data to display   Initial Impression / Assessment and Plan / ED Course  I have reviewed the triage vital signs and the nursing notes.  Pertinent labs & imaging results that were available during my care of the patient were reviewed by me and considered in my medical decision making (see chart for details).    Patient with chronic back pain.  No neurological deficits and normal neuro exam.  Patient can walk but states is painful.  No loss of bowel or bladder control.  No concern for cauda equina.  No fever, night sweats, weight loss, h/o cancer, IVDU.  RICE protocol and pain medicine indicated and discussed with patient. Orthopedic referral given. Patient is safe for discharge home.  Discussed results, findings, treatment and follow up. Patient advised of return precautions. Patient verbalized understanding and agreed with plan.  Final Clinical Impressions(s) / ED Diagnoses   Final diagnoses:  Muscle spasm of back    New Prescriptions New Prescriptions   CYCLOBENZAPRINE (FLEXERIL) 10 MG TABLET    Take 1 tablet (10 mg total) by mouth at bedtime as needed for muscle spasms.   I personally performed the services described in this documentation, which was scribed in my presence. The recorded information has been reviewed and is accurate.     Russo, [place] N, PA-C 09/28/16 1408    Benjiman CorePickering, Nathan, MD 09/28/16 [phone]

## 2016-12-01 ENCOUNTER — Ambulatory Visit (HOSPITAL_COMMUNITY)
Admission: EM | Admit: 2016-12-01 | Discharge: 2016-12-01 | Disposition: A | Discharge status: Home / Self Care | Payer: Self-pay | Attending: Emergency Medicine | Admitting: Emergency Medicine

## 2016-12-01 ENCOUNTER — Encounter (HOSPITAL_COMMUNITY): Payer: Self-pay | Admitting: *Deleted

## 2016-12-01 DIAGNOSIS — Z113 Encounter for screening for infections with a predominantly sexual mode of transmission: Secondary | ICD-10-CM

## 2016-12-01 DIAGNOSIS — Z202 Contact with and (suspected) exposure to infections with a predominantly sexual mode of transmission: Secondary | ICD-10-CM | POA: Insufficient documentation

## 2016-12-01 DIAGNOSIS — F17210 Nicotine dependence, cigarettes, uncomplicated: Secondary | ICD-10-CM | POA: Insufficient documentation

## 2016-12-01 DIAGNOSIS — Z7253 High risk bisexual behavior: Secondary | ICD-10-CM

## 2016-12-01 NOTE — ED Triage Notes (Signed)
Denies any sxs.  Requesting STD testing for unprotected sex couple days ago.

## 2016-12-01 NOTE — Discharge Instructions (Signed)
We will notify you if your results are positive.  Use a condom for every sexual encounter to prevent sexually transmitted diseases.

## 2016-12-01 NOTE — ED Provider Notes (Signed)
[facility]    CSN: [id number] Arrival date & time: 12/01/16  1841    History   Chief Complaint Chief Complaint  Patient presents with  . Unprotected Sex    HPI   Thomas [name] is a 26 y.o. male presenting tonight with concerns that he might have a sexual transmitted infection. Patient states his most recent sexual partner reported she had HPV. He is concerned as he did not use condoms. Denies any symptoms or issues. Just wants STD check.     History reviewed. No pertinent past medical history.  There are no active problems to display for this patient.   History reviewed. No pertinent surgical history.     Home Medications    Prior to Admission medications   Medication Sig Start Date End Date Taking? Authorizing Provider  acetaminophen (TYLENOL) 325 MG tablet Take 650 mg by mouth every 6 (six) hours as needed (PAIN).    [provider]  ciprofloxacin (CIPRO) 500 MG tablet Take 1 tablet (500 mg total) by mouth 2 (two) times daily. 09/04/15   Teressa LowerPickering, Vrinda, NP  cyclobenzaprine (FLEXERIL) 10 MG tablet Take 1 tablet (10 mg total) by mouth at bedtime as needed for muscle spasms. 09/28/16   Russo, [place] N, PA-C  ibuprofen (ADVIL,MOTRIN) 800 MG tablet Take 1 tablet (800 mg total) by mouth 3 (three) times daily. 06/19/15   Danelle Berryapia, Leisa, PA-C  naproxen (NAPROSYN) 500 MG tablet Take 1 tablet (500 mg total) by mouth 2 (two) times daily as needed for mild pain, moderate pain or headache (TAKE WITH MEALS.). 10/06/14   Street, [place], PA-C  traMADol (ULTRAM) 50 MG tablet Take 1 tablet (50 mg total) by mouth every 12 (twelve) hours as needed for severe pain. 06/19/15   Danelle Berryapia, Leisa, PA-C    Family History No family history on file.  Social History Social History  Substance Use Topics  . Smoking status: Current Every Day Smoker    Types: Cigarettes  . Smokeless tobacco: Never Used  . Alcohol use No     Allergies   Patient has no known allergies.   Review  of Systems Review of Systems  Constitutional: Negative.  Negative for fatigue and fever.  HENT: Negative.   Eyes: Negative.   Respiratory: Negative.  Negative for cough and shortness of breath.   Cardiovascular: Negative.   Gastrointestinal: Negative.  Negative for diarrhea, nausea and vomiting.  Endocrine: Negative.   Genitourinary: Negative.   Musculoskeletal: Negative.   Skin: Negative.  Negative for rash.  Allergic/Immunologic: Negative.   Neurological: Negative.  Negative for headaches.  Hematological: Negative.   Psychiatric/Behavioral: Negative.      Physical Exam Triage Vital Signs ED Triage Vitals [12/01/16 1944]  Enc Vitals Group     BP (!) 149/93     Pulse Rate 77     Resp 16     Temp 98.5 F (36.9 C)     Temp Source Oral     SpO2 100 %     Weight      Height      Head Circumference      Peak Flow      Pain Score      Pain Loc      Pain Edu?      Excl. in GC?    No data found.   Updated Vital Signs BP (!) 149/93   Pulse 77   Temp 98.5 F (36.9 C) (Oral)   Resp 16   SpO2 100%  Physical Exam  Constitutional: He appears well-developed and well-nourished.  HENT:  Head: Normocephalic and atraumatic.  Eyes: Conjunctivae are normal.  Cardiovascular: Normal rate and regular rhythm.   No murmur heard. Pulmonary/Chest: Effort normal and breath sounds normal. No respiratory distress.  Abdominal: Soft. There is no tenderness.  Musculoskeletal: He exhibits no edema.  Neurological: He is alert.  Skin: Skin is warm and dry.  Psychiatric: He has a normal mood and affect.  Nursing note and vitals reviewed.  The patient denies any generalized malaise, fever, chills, eye problems, sore throat, skin rash or joint pain.  Denies abdominal pain, nausea, or vomiting. In addition, the patient denies any dysuria, burning, testicular pain or swelling, lesions of the penis or penile discharge.  UC Treatments / Results  Labs (all labs ordered are listed, but  only abnormal results are displayed) Labs Reviewed  HIV ANTIBODY (ROUTINE TESTING)  RPR  URINE CYTOLOGY ANCILLARY ONLY   Labwork is pending.   EKG  EKG Interpretation None       Radiology No results found.  Procedures Procedures (including critical care time)  Medications Ordered in UC Medications - No data to display   Initial Impression / Assessment and Plan / UC Course  I have reviewed the triage vital signs and the nursing notes.  Pertinent labs & imaging results that were available during my care of the patient were reviewed by me and considered in my medical decision making (see chart for details).    Final Clinical Impressions(s) / UC Diagnoses   Final diagnoses:  High risk bisexual behavior    New Prescriptions Discharge Medication List as of 12/01/2016  8:43 PM     Discussed safe sexual practices. An prevalence of HPV and other sexual transmitted infections with her community. The usual and customary discharge instructions and warnings were given.  The patient verbalizes understanding and agrees to plan of care.     Controlled Substance Prescriptions [place] Controlled Substance Registry consulted? Not Applicable   Servando Salina, NP 12/01/16 2155

## 2016-12-02 LAB — HIV ANTIBODY (ROUTINE TESTING W REFLEX): HIV Screen 4th Generation wRfx: NONREACTIVE

## 2016-12-02 LAB — RPR: RPR: NONREACTIVE

## 2016-12-02 LAB — URINE CYTOLOGY ANCILLARY ONLY
Chlamydia: NEGATIVE
Neisseria Gonorrhea: NEGATIVE

## 2017-01-30 ENCOUNTER — Emergency Department (HOSPITAL_COMMUNITY)
Admission: EM | Admit: 2017-01-30 | Discharge: 2017-01-30 | Disposition: A | Discharge status: Home / Self Care | Payer: Self-pay | Attending: Emergency Medicine | Admitting: Emergency Medicine

## 2017-01-30 DIAGNOSIS — Z5321 Procedure and treatment not carried out due to patient leaving prior to being seen by health care provider: Secondary | ICD-10-CM | POA: Insufficient documentation

## 2017-01-30 DIAGNOSIS — R197 Diarrhea, unspecified: Secondary | ICD-10-CM | POA: Insufficient documentation

## 2017-01-30 LAB — COMPREHENSIVE METABOLIC PANEL
ALT: 20 U/L (ref 17–63)
ANION GAP: 5 (ref 5–15)
AST: 25 U/L (ref 15–41)
Albumin: 3.4 g/dL — ABNORMAL LOW (ref 3.5–5.0)
Alkaline Phosphatase: 47 U/L (ref 38–126)
BUN: 7 mg/dL (ref 6–20)
CHLORIDE: 109 mmol/L (ref 101–111)
CO2: 25 mmol/L (ref 22–32)
Calcium: 8.3 mg/dL — ABNORMAL LOW (ref 8.9–10.3)
Creatinine, Ser: 1.07 mg/dL (ref 0.61–1.24)
Glucose, Bld: 108 mg/dL — ABNORMAL HIGH (ref 65–99)
POTASSIUM: 3.8 mmol/L (ref 3.5–5.1)
SODIUM: 139 mmol/L (ref 135–145)
Total Bilirubin: 0.7 mg/dL (ref 0.3–1.2)
Total Protein: 5.3 g/dL — ABNORMAL LOW (ref 6.5–8.1)

## 2017-01-30 LAB — URINALYSIS, ROUTINE W REFLEX MICROSCOPIC
BILIRUBIN URINE: NEGATIVE
Glucose, UA: NEGATIVE mg/dL
Hgb urine dipstick: NEGATIVE
KETONES UR: NEGATIVE mg/dL
Nitrite: NEGATIVE
PH: 7 (ref 5.0–8.0)
PROTEIN: NEGATIVE mg/dL
Specific Gravity, Urine: 1.019 (ref 1.005–1.030)

## 2017-01-30 LAB — CBC
HEMATOCRIT: 42.7 % (ref 39.0–52.0)
HEMOGLOBIN: 14.6 g/dL (ref 13.0–17.0)
MCH: 28.8 pg (ref 26.0–34.0)
MCHC: 34.2 g/dL (ref 30.0–36.0)
MCV: 84.2 fL (ref 78.0–100.0)
PLATELETS: 162 10*3/uL (ref 150–400)
RBC: 5.07 MIL/uL (ref 4.22–5.81)
RDW: 13.2 % (ref 11.5–15.5)
WBC: 5.2 10*3/uL (ref 4.0–10.5)

## 2017-01-30 LAB — LIPASE, BLOOD: LIPASE: 28 U/L (ref 11–51)

## 2017-01-30 MED ORDER — ONDANSETRON 4 MG PO TBDP: 4.0000 mg | ORAL_TABLET | Freq: Once | ORAL | Status: DC | PRN

## 2017-01-30 NOTE — ED Notes (Signed)
Second call in lobby for vitals update. No response. 
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
## 2017-01-30 NOTE — ED Notes (Signed)
Pt called in lobby for vitals update. No response. 

## 2017-10-01 ENCOUNTER — Emergency Department (HOSPITAL_COMMUNITY): Payer: Self-pay

## 2017-10-01 ENCOUNTER — Other Ambulatory Visit: Payer: Self-pay

## 2017-10-01 ENCOUNTER — Emergency Department (HOSPITAL_COMMUNITY)
Admission: EM | Admit: 2017-10-01 | Discharge: 2017-10-01 | Disposition: A | Discharge status: Home / Self Care | Payer: Self-pay | Attending: Emergency Medicine | Admitting: Emergency Medicine

## 2017-10-01 ENCOUNTER — Encounter (HOSPITAL_COMMUNITY): Payer: Self-pay | Admitting: Emergency Medicine

## 2017-10-01 DIAGNOSIS — S0280XA Fracture of other specified skull and facial bones, unspecified side, initial encounter for closed fracture: Secondary | ICD-10-CM

## 2017-10-01 DIAGNOSIS — F17210 Nicotine dependence, cigarettes, uncomplicated: Secondary | ICD-10-CM | POA: Insufficient documentation

## 2017-10-01 DIAGNOSIS — S0285XA Fracture of orbit, unspecified, initial encounter for closed fracture: Secondary | ICD-10-CM

## 2017-10-01 DIAGNOSIS — R55 Syncope and collapse: Secondary | ICD-10-CM | POA: Insufficient documentation

## 2017-10-01 MED ORDER — IBUPROFEN 600 MG PO TABS: 600.0000 mg | ORAL_TABLET | Freq: Four times a day (QID) | ORAL | 0 refills | Status: DC | PRN

## 2017-10-01 MED ORDER — OXYCODONE-ACETAMINOPHEN 5-325 MG PO TABS: 1.0000 | ORAL_TABLET | Freq: Four times a day (QID) | ORAL | 0 refills | Status: DC | PRN

## 2017-10-01 MED ORDER — MUPIROCIN CALCIUM 2 % EX CREA: 1.0000 "application " | TOPICAL_CREAM | Freq: Two times a day (BID) | CUTANEOUS | 0 refills | Status: DC

## 2017-10-01 MED ORDER — OXYCODONE-ACETAMINOPHEN 5-325 MG PO TABS
1.0000 | ORAL_TABLET | Freq: Once | ORAL | Status: AC
  Administered 2017-10-01: 1 via ORAL
  Filled 2017-10-01: qty 1
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
## 2017-10-20 ENCOUNTER — Other Ambulatory Visit: Payer: Self-pay

## 2017-10-20 ENCOUNTER — Encounter (HOSPITAL_COMMUNITY): Payer: Self-pay

## 2017-10-20 ENCOUNTER — Emergency Department (HOSPITAL_COMMUNITY)
Admission: EM | Admit: 2017-10-20 | Discharge: 2017-10-20 | Disposition: A | Discharge status: Home / Self Care | Payer: Self-pay | Attending: Emergency Medicine | Admitting: Emergency Medicine

## 2017-10-20 DIAGNOSIS — R369 Urethral discharge, unspecified: Secondary | ICD-10-CM | POA: Insufficient documentation

## 2017-10-20 DIAGNOSIS — Z113 Encounter for screening for infections with a predominantly sexual mode of transmission: Secondary | ICD-10-CM | POA: Insufficient documentation

## 2017-10-20 DIAGNOSIS — Z711 Person with feared health complaint in whom no diagnosis is made: Secondary | ICD-10-CM

## 2017-10-20 DIAGNOSIS — F17210 Nicotine dependence, cigarettes, uncomplicated: Secondary | ICD-10-CM | POA: Insufficient documentation

## 2017-10-20 LAB — URINALYSIS, ROUTINE W REFLEX MICROSCOPIC
Bilirubin Urine: NEGATIVE
GLUCOSE, UA: NEGATIVE mg/dL
HGB URINE DIPSTICK: NEGATIVE
Ketones, ur: NEGATIVE mg/dL
LEUKOCYTES UA: NEGATIVE
Nitrite: NEGATIVE
PROTEIN: NEGATIVE mg/dL
SPECIFIC GRAVITY, URINE: 1.002 — AB (ref 1.005–1.030)
pH: 8 (ref 5.0–8.0)

## 2017-10-20 MED ORDER — CEFTRIAXONE SODIUM 250 MG IJ SOLR
250.0000 mg | Freq: Once | INTRAMUSCULAR | Status: AC
  Administered 2017-10-20: 250 mg via INTRAMUSCULAR
  Filled 2017-10-20: qty 250

## 2017-10-20 MED ORDER — AZITHROMYCIN 250 MG PO TABS
1000.0000 mg | ORAL_TABLET | Freq: Once | ORAL | Status: AC
  Administered 2017-10-20: 1000 mg via ORAL
  Filled 2017-10-20: qty 4

## 2017-10-20 MED ORDER — ONDANSETRON 4 MG PO TBDP
4.0000 mg | ORAL_TABLET | Freq: Once | ORAL | Status: AC
  Administered 2017-10-20: 4 mg via ORAL
  Filled 2017-10-20: qty 1

## 2017-10-20 MED ORDER — LIDOCAINE HCL 1 % IJ SOLN
INTRAMUSCULAR | Status: AC
  Administered 2017-10-20: 20 mL
  Filled 2017-10-20: qty 20
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
## 2017-10-20 NOTE — ED Notes (Signed)
Bed: WTR7 Expected date:  Expected time:  Means of arrival:  Comments: 

## 2017-10-20 NOTE — ED Provider Notes (Signed)
[facility]-EMERGENCY DEPT Provider Note   CSN: [id number] Arrival date & time: 10/20/17  1146     History   Chief Complaint Chief Complaint  Patient presents with  . STD Check    HPI Thomas [name] is a 27 y.o. male who presents today for evaluation of penile discharge.  He reports that for the past week he has had some burning with urination along with waking up with white discharge from his penis.  He reports that this morning he walked in on his girlfriend cheating on him having unprotected sex.  He reports that he and his girlfriend usually have unprotected sex.  He denies any pain with bowel movements.  No recent night sweats, abdominal pain, fevers, or other concerns today.  Reports that he has friends including a close friend who is here with him today who he feels like he can discuss this event with.  HPI  History reviewed. No pertinent past medical history.  There are no active problems to display for this patient.   History reviewed. No pertinent surgical history.      Home Medications    Prior to Admission medications   Medication Sig Start Date End Date Taking? Authorizing Provider  ibuprofen (ADVIL,MOTRIN) 600 MG tablet Take 1 tablet (600 mg total) by mouth every 6 (six) hours as needed. 10/01/17   Law, Waylan Boga, PA-C  mupirocin cream (BACTROBAN) 2 % Apply 1 application topically 2 (two) times daily. 10/01/17   Law, Waylan Boga, PA-C  oxyCODONE-acetaminophen (PERCOCET/ROXICET) 5-325 MG tablet Take 1-2 tablets by mouth every 6 (six) hours as needed for severe pain. 10/01/17   Emi Holes, PA-C    Family History History reviewed. No pertinent family history.  Social History Social History   Tobacco Use  . Smoking status: Current Every Day Smoker    Types: Cigarettes  . Smokeless tobacco: Never Used  Substance Use Topics  . Alcohol use: No  . Drug use: No     Allergies   Patient has no known allergies.   Review of  Systems Review of Systems  Constitutional: Negative for chills and fever.  Gastrointestinal: Negative for abdominal pain, diarrhea and vomiting.  Genitourinary: Positive for discharge and dysuria. Negative for flank pain, genital sores, penile pain, penile swelling, scrotal swelling and testicular pain.  Neurological: Negative for weakness and numbness.     Physical Exam Updated Vital Signs BP (!) 148/109 (BP Location: Right Arm)   Pulse 77   Temp 98.6 F (37 C) (Oral)   Resp 18   Ht 6' (1.829 m)   Wt 105.7 kg (233 lb)   SpO2 100%   BMI 31.60 kg/m   Physical Exam  Constitutional: He is oriented to person, place, and time. He appears well-developed. No distress.  HENT:  Head: Normocephalic.  Genitourinary: Testes normal and penis normal. Circumcised. No discharge found.  Genitourinary Comments: Exam performed with chaperone in room.   Neurological: He is alert and oriented to person, place, and time.  Skin: Skin is warm and dry. No rash noted. He is not diaphoretic.  Psychiatric: He has a normal mood and affect. His behavior is normal.  Nursing note and vitals reviewed.    ED Treatments / Results  Labs (all labs ordered are listed, but only abnormal results are displayed) Labs Reviewed  RPR  HIV ANTIBODY (ROUTINE TESTING)  URINALYSIS, ROUTINE W REFLEX MICROSCOPIC  GC/CHLAMYDIA PROBE AMP ([place]) NOT AT [facility]    EKG None  Radiology  No results found.  Procedures Procedures (including critical care time)  Medications Ordered in ED Medications  cefTRIAXone (ROCEPHIN) injection 250 mg (has no administration in time range)  azithromycin (ZITHROMAX) tablet 1,000 mg (has no administration in time range)  ondansetron (ZOFRAN-ODT) disintegrating tablet 4 mg (has no administration in time range)     Initial Impression / Assessment and Plan / ED Course  I have reviewed the triage vital signs and the nursing notes.  Pertinent labs & imaging results that were  available during my care of the patient were reviewed by me and considered in my medical decision making (see chart for details).    Patient is afebrile without abdominal tenderness, abdominal pain or painful bowel movements to indicate prostatitis.  No tenderness to palpation of the testes or epididymis to suggest orchitis or epididymitis.  STD cultures obtained including HIV, syphilis, gonorrhea and chlamydia. Patient to be discharged with instructions to follow up with PCP. Discussed importance of using protection when sexually active. Pt understands that they have GC/Chlamydia cultures pending and that they will need to inform all sexual partners if results return positive. Patient has been treated prophylactically with azithromycin and Rocephin.   He was informed that his blood pressure is high and that he needs to follow-up.  Wellness referral given.    Final Clinical Impressions(s) / ED Diagnoses   Final diagnoses:  Penile discharge  Concern about STD in male without diagnosis    ED Discharge Orders    None       Norman ClayHammond, Loye Vento W, PA-C 10/20/17 1314    Gerhard MunchLockwood, Robert, MD 10/20/17 1546

## 2017-10-20 NOTE — ED Notes (Signed)
Pt reports that he has a white discharge from his penis, pt reports that he has a some burning with urination . Onset of symptons started 1 week ago.

## 2017-10-20 NOTE — ED Triage Notes (Signed)
Pt arrives via POV from home. Pt reports that for 1 week he has had penile discharge and painful urination. Pt reports unprotected sex with girlfriend who has been with other partners.

## 2017-10-21 LAB — RPR: RPR Ser Ql: NONREACTIVE

## 2017-10-21 LAB — HIV ANTIBODY (ROUTINE TESTING W REFLEX): HIV Screen 4th Generation wRfx: NONREACTIVE

## 2017-10-22 LAB — GC/CHLAMYDIA PROBE AMP (~~LOC~~) NOT AT ARMC
CHLAMYDIA, DNA PROBE: NEGATIVE
Neisseria Gonorrhea: NEGATIVE

## 2017-12-07 ENCOUNTER — Emergency Department (HOSPITAL_COMMUNITY)
Admission: EM | Admit: 2017-12-07 | Discharge: 2017-12-07 | Disposition: A | Discharge status: Home / Self Care | Payer: Self-pay | Attending: Emergency Medicine | Admitting: Emergency Medicine

## 2017-12-07 ENCOUNTER — Other Ambulatory Visit: Payer: Self-pay

## 2017-12-07 ENCOUNTER — Encounter (HOSPITAL_COMMUNITY): Payer: Self-pay | Admitting: Emergency Medicine

## 2017-12-07 DIAGNOSIS — J069 Acute upper respiratory infection, unspecified: Secondary | ICD-10-CM | POA: Insufficient documentation

## 2017-12-07 DIAGNOSIS — F17210 Nicotine dependence, cigarettes, uncomplicated: Secondary | ICD-10-CM | POA: Insufficient documentation

## 2017-12-07 DIAGNOSIS — K047 Periapical abscess without sinus: Secondary | ICD-10-CM | POA: Insufficient documentation

## 2017-12-07 MED ORDER — AMOXICILLIN 500 MG PO CAPS
500.0000 mg | ORAL_CAPSULE | Freq: Once | ORAL | Status: AC
Start: 2017-12-07 — End: 2017-12-07
  Administered 2017-12-07: 500 mg via ORAL
  Filled 2017-12-07: qty 1

## 2017-12-07 MED ORDER — GUAIFENESIN 100 MG/5ML PO SOLN
5.0000 mL | Freq: Once | ORAL | Status: AC
  Administered 2017-12-07: 100 mg via ORAL
  Filled 2017-12-07: qty 5

## 2017-12-07 MED ORDER — AMOXICILLIN 500 MG PO CAPS: 500.0000 mg | ORAL_CAPSULE | Freq: Three times a day (TID) | ORAL | 0 refills | Status: DC

## 2017-12-07 MED ORDER — BENZONATATE 100 MG PO CAPS: 100.0000 mg | ORAL_CAPSULE | Freq: Three times a day (TID) | ORAL | 0 refills | Status: AC

## 2017-12-07 MED ORDER — NAPROXEN 500 MG PO TABS: 500.0000 mg | ORAL_TABLET | Freq: Two times a day (BID) | ORAL | 0 refills | Status: AC

## 2017-12-07 NOTE — ED Provider Notes (Signed)
[facility] EMERGENCY DEPARTMENT Provider Note   CSN: [id number] Arrival date & time: 12/07/17  2009     History   Chief Complaint Chief Complaint  Patient presents with  . Nasal Congestion    HPI Thomas [name] is a 27 y.o. male who present to the ED with nasal congestion, cough, sore throat that started early this morning. Patient also reports dental pain that started a couple days ago and has gotten worse.   HPI  History reviewed. No pertinent past medical history.  There are no active problems to display for this patient.   History reviewed. No pertinent surgical history.      Home Medications    Prior to Admission medications   Medication Sig Start Date End Date Taking? Authorizing Provider  amoxicillin (AMOXIL) 500 MG capsule Take 1 capsule (500 mg total) by mouth 3 (three) times daily. 12/07/17   Janne Napoleon, NP  benzonatate (TESSALON) 100 MG capsule Take 1 capsule (100 mg total) by mouth every 8 (eight) hours. 12/07/17   Janne Napoleon, NP  naproxen (NAPROSYN) 500 MG tablet Take 1 tablet (500 mg total) by mouth 2 (two) times daily. 12/07/17   Janne Napoleon, NP    Family History No family history on file.  Social History Social History   Tobacco Use  . Smoking status: Current Every Day Smoker    Types: Cigarettes  . Smokeless tobacco: Never Used  Substance Use Topics  . Alcohol use: No  . Drug use: No     Allergies   Patient has no known allergies.   Review of Systems Review of Systems  Constitutional: Positive for chills. Negative for fever.  HENT: Positive for congestion, dental problem and sore throat.   Eyes: Negative for visual disturbance.  Respiratory: Positive for cough. Negative for shortness of breath.   Cardiovascular: Negative for chest pain.  Gastrointestinal: Negative for abdominal pain, nausea and vomiting.  Musculoskeletal: Negative for back pain.  Skin: Negative for rash.  Neurological: Positive for headaches  (mild). Negative for syncope.  Psychiatric/Behavioral: Negative for confusion.     Physical Exam Updated Vital Signs BP 130/89   Pulse 85   Temp 98.6 F (37 C)   Resp 17   SpO2 98%   Physical Exam  Constitutional: He is oriented to person, place, and time. He appears well-developed and well-nourished. No distress.  HENT:  Head: Normocephalic and atraumatic.  Right Ear: Tympanic membrane normal.  Left Ear: Tympanic membrane normal.  Nose: Nose normal.  Mouth/Throat: Uvula is midline and mucous membranes are normal. Dental caries present. Posterior oropharyngeal erythema present.    Tenderness to the left upper dental area on exam. Gum surrounding the tooth is swollen and tender.   Eyes: Pupils are equal, round, and reactive to light. Conjunctivae and EOM are normal.  Neck: Normal range of motion. Neck supple.  Cardiovascular: Normal rate and regular rhythm.  Pulmonary/Chest: Effort normal and breath sounds normal.  Abdominal: Soft. Bowel sounds are normal. There is no tenderness.  Musculoskeletal: Normal range of motion.  Lymphadenopathy:    He has no cervical adenopathy.  Neurological: He is alert and oriented to person, place, and time. No cranial nerve deficit.  Skin: Skin is warm and dry.  Psychiatric: He has a normal mood and affect. His behavior is normal.  Nursing note and vitals reviewed.    ED Treatments / Results  Labs (all labs ordered are listed, but only abnormal results are displayed) Labs Reviewed -  No data to display  Radiology No results found.  Procedures Procedures (including critical care time)  Medications Ordered in ED Medications  amoxicillin (AMOXIL) capsule 500 mg (500 mg Oral Given 12/07/17 2209)  guaiFENesin (ROBITUSSIN) 100 MG/5ML solution 100 mg (100 mg Oral Given 12/07/17 2209)     Initial Impression / Assessment and Plan / ED Course  I have reviewed the triage vital signs and the nursing notes. Patient with toothache.  No gross  abscess.  Exam unconcerning for Ludwig's angina or spread of infection.  Will treat with Amoxicillin and anti-inflammatories medicine.  Urged patient to follow-up with dentist.   Patients symptoms are consistent with URI, likely viral etiology. Discussed that antibiotics are not indicated for viral infections and he is only getting Amoxicillin due to the dental infection. Pt will be discharged with symptomatic treatment.  Verbalizes understanding and is agreeable with plan. Pt is hemodynamically stable & in NAD prior to dc. Final Clinical Impressions(s) / ED Diagnoses   Final diagnoses:  Acute URI  Dental infection    ED Discharge Orders         Ordered    amoxicillin (AMOXIL) 500 MG capsule  3 times daily     12/07/17 2133    naproxen (NAPROSYN) 500 MG tablet  2 times daily     12/07/17 2133    benzonatate (TESSALON) 100 MG capsule  Every 8 hours     12/07/17 2136           Kerrie Buffaloeese, Hope AdrianM, NP 12/08/17 0052    Virgina Norfolkuratolo, Adam, DO 12/09/17 0142

## 2017-12-07 NOTE — ED Notes (Signed)
Requested medication from pharmacy.

## 2017-12-07 NOTE — Discharge Instructions (Addendum)
Take the antibiotic for your tooth. Follow up with a dentist as soon as possible. Take the medications for your cough and congestion as directed. Follow up with [place] [facility]. Return here as needed.

## 2017-12-07 NOTE — ED Triage Notes (Signed)
Pt c/o nasal congestion that started this morning at 0730.

## 2017-12-13 ENCOUNTER — Encounter (HOSPITAL_COMMUNITY): Payer: Self-pay | Admitting: *Deleted

## 2017-12-13 ENCOUNTER — Emergency Department (HOSPITAL_COMMUNITY)
Admission: EM | Admit: 2017-12-13 | Discharge: 2017-12-13 | Disposition: A | Discharge status: Home / Self Care | Payer: Self-pay | Attending: Emergency Medicine | Admitting: Emergency Medicine

## 2017-12-13 ENCOUNTER — Other Ambulatory Visit: Payer: Self-pay

## 2017-12-13 DIAGNOSIS — F17210 Nicotine dependence, cigarettes, uncomplicated: Secondary | ICD-10-CM | POA: Insufficient documentation

## 2017-12-13 DIAGNOSIS — K0889 Other specified disorders of teeth and supporting structures: Secondary | ICD-10-CM | POA: Insufficient documentation

## 2017-12-13 MED ORDER — AMOXICILLIN-POT CLAVULANATE 875-125 MG PO TABS: 1.0000 | ORAL_TABLET | Freq: Two times a day (BID) | ORAL | 0 refills | Status: AC

## 2017-12-13 NOTE — ED Triage Notes (Signed)
Pt complains of left upper dental pain. [facility] but states pain medication has not helped.

## 2017-12-13 NOTE — ED Provider Notes (Signed)
[facility]-EMERGENCY DEPT Provider Note   CSN: [id number] Arrival date & time: 12/13/17  1556     History   Chief Complaint Chief Complaint  Patient presents with  . Dental Pain    HPI Thomas [name] is a 27 y.o. male.  HPI  Pt is 27 y/o male presenting with left upper dental pain for about 1 week. Pain constant. Worse with temperature changes. Pain is severe. States [facility] and was given antibiotic. States he took one dose of antibiotic and stopped taking it because his pain did not improve.   No fevers or chills.  History reviewed. No pertinent past medical history.  There are no active problems to display for this patient.   History reviewed. No pertinent surgical history.      Home Medications    Prior to Admission medications   Medication Sig Start Date End Date Taking? Authorizing Provider  amoxicillin-clavulanate (AUGMENTIN) 875-125 MG tablet Take 1 tablet by mouth 2 (two) times daily for 7 days. 12/13/17 12/20/17  Nakiesha Rumsey S, PA-C  benzonatate (TESSALON) 100 MG capsule Take 1 capsule (100 mg total) by mouth every 8 (eight) hours. 12/07/17   Janne NapoleonNeese, Hope M, NP  naproxen (NAPROSYN) 500 MG tablet Take 1 tablet (500 mg total) by mouth 2 (two) times daily. 12/07/17   Janne NapoleonNeese, Hope M, NP    Family History No family history on file.  Social History Social History   Tobacco Use  . Smoking status: Current Every Day Smoker    Types: Cigarettes  . Smokeless tobacco: Never Used  Substance Use Topics  . Alcohol use: No  . Drug use: No     Allergies   Patient has no known allergies.   Review of Systems Review of Systems  Constitutional: Negative for chills and fever.  HENT: Positive for dental problem.      Physical Exam Updated Vital Signs BP (!) 143/95 (BP Location: Left Arm)   Pulse 78   Temp 99.1 F (37.3 C) (Oral)   Resp 16   SpO2 100%   Physical Exam  Constitutional: He is oriented to person, place, and  time. He appears well-developed and well-nourished. No distress.  HENT:  Left Ear: External ear normal.  Mouth/Throat: Oropharynx is clear and moist.  Dental caries to tooth #14 with TTP. No periapical abscess noted. No swelling beneath tongue.  Eyes: Conjunctivae are normal.  Neck: Neck supple.  Cardiovascular: Normal rate and regular rhythm.  Pulmonary/Chest: Effort normal and breath sounds normal.  Lymphadenopathy:    He has no cervical adenopathy.  Neurological: He is alert and oriented to person, place, and time.  Skin: Skin is warm and dry.     ED Treatments / Results  Labs (all labs ordered are listed, but only abnormal results are displayed) Labs Reviewed - No data to display  EKG None  Radiology No results found.  Procedures Procedures (including critical care time)  Medications Ordered in ED Medications - No data to display   Initial Impression / Assessment and Plan / ED Course  I have reviewed the triage vital signs and the nursing notes.  Pertinent labs & imaging results that were available during my care of the patient were reviewed by me and considered in my medical decision making (see chart for details).      Final Clinical Impressions(s) / ED Diagnoses   Final diagnoses:  Pain, dental   Patient with toothache.  No gross abscess.  Exam unconcerning  for Ludwig's angina or spread of infection. Pt educated that antibiotics treat infections and do not work like a pain reliever. Will change amox TID to augmentin BID to increase probability of compliance with medication. Advised tylenol and ibuprofen for pain. Resources for a dentist given. Urged patient to follow-up with dentist.   Advised to return if worse. Pt requested work note stating he cannot go to work with this type of pain.  ED Discharge Orders         Ordered    amoxicillin-clavulanate (AUGMENTIN) 875-125 MG tablet  2 times daily     12/13/17 1810           Nicoli Nardozzi, [place],  PA-C 12/13/17 1812    Charlynne Pander, MD 12/13/17 [phone]

## 2017-12-13 NOTE — Discharge Instructions (Signed)
You were given a prescription for antibiotics. Please take the antibiotic prescription fully.   You may alternate taking Tylenol and Ibuprofen as needed for pain control. You may take 400-600 mg of ibuprofen every 6 hours and [phone] mg of Tylenol every 6 hours. Do not exceed 4000 mg of Tylenol daily as this can lead to liver damage. Also, make sure to take Ibuprofen with meals as it can cause an upset stomach. Do not take other NSAIDs while taking Ibuprofen such as (Aleve, Naprosyn, Aspirin, Celebrex, etc) and do not take more than the prescribed dose as this can lead to ulcers and bleeding in your GI tract. You may use warm and cold compresses to help with your symptoms.   Please follow up with your primary doctor within the next 7-10 days for re-evaluation and further treatment of your symptoms.   Please return to the ER sooner if you have any new or worsening symptoms.  Please follow up with a dentist.

## 2017-12-22 ENCOUNTER — Emergency Department (HOSPITAL_COMMUNITY)
Admission: EM | Admit: 2017-12-22 | Discharge: 2017-12-22 | Disposition: A | Discharge status: Home / Self Care | Payer: Self-pay | Attending: Emergency Medicine | Admitting: Emergency Medicine

## 2017-12-22 ENCOUNTER — Encounter (HOSPITAL_COMMUNITY): Payer: Self-pay | Admitting: Emergency Medicine

## 2017-12-22 DIAGNOSIS — R369 Urethral discharge, unspecified: Secondary | ICD-10-CM

## 2017-12-22 DIAGNOSIS — R360 Urethral discharge without blood: Secondary | ICD-10-CM | POA: Insufficient documentation

## 2017-12-22 DIAGNOSIS — F17210 Nicotine dependence, cigarettes, uncomplicated: Secondary | ICD-10-CM | POA: Insufficient documentation

## 2017-12-22 LAB — URINALYSIS, ROUTINE W REFLEX MICROSCOPIC
Bilirubin Urine: NEGATIVE
GLUCOSE, UA: NEGATIVE mg/dL
Hgb urine dipstick: NEGATIVE
KETONES UR: NEGATIVE mg/dL
NITRITE: NEGATIVE
PH: 7 (ref 5.0–8.0)
Protein, ur: NEGATIVE mg/dL
Specific Gravity, Urine: 1.01 (ref 1.005–1.030)

## 2017-12-22 LAB — WET PREP, GENITAL
Clue Cells Wet Prep HPF POC: NONE SEEN
Sperm: NONE SEEN
Trich, Wet Prep: NONE SEEN
Yeast Wet Prep HPF POC: NONE SEEN

## 2017-12-22 MED ORDER — STERILE WATER FOR INJECTION IJ SOLN
INTRAMUSCULAR | Status: AC
  Administered 2017-12-22: 0.9 mL
  Filled 2017-12-22: qty 10

## 2017-12-22 MED ORDER — CEFTRIAXONE SODIUM 250 MG IJ SOLR
250.0000 mg | Freq: Once | INTRAMUSCULAR | Status: AC
  Administered 2017-12-22: 250 mg via INTRAMUSCULAR
  Filled 2017-12-22: qty 250

## 2017-12-22 MED ORDER — AZITHROMYCIN 250 MG PO TABS
1000.0000 mg | ORAL_TABLET | Freq: Once | ORAL | Status: AC
  Administered 2017-12-22: 1000 mg via ORAL
  Filled 2017-12-22: qty 4

## 2017-12-22 NOTE — ED Provider Notes (Signed)
[facility]-EMERGENCY DEPT Provider Note  CSN: [id number] Arrival date & time: 12/22/17  1017   History   Chief Complaint Chief Complaint  Patient presents with  . Penile Discharge  . Dysuria    HPI Thomas [name] is a 27 y.o. male with no signficant medical history who presented to the ED for penile discharge. He states he first noticed a yellow, thick discharge this morning when he woke up. Associated symptom: dysuria. Denies fever, chills, arthralgias, scrotal/testicular pain, urinary urgency, frequency, abdominal pain, hematuria, N/V or changes in bowel habits. Patient states that he is sexually active with a single male partner.    History reviewed. No pertinent past medical history.  There are no active problems to display for this patient.   History reviewed. No pertinent surgical history.      Home Medications    Prior to Admission medications   Medication Sig Start Date End Date Taking? Authorizing Provider  benzonatate (TESSALON) 100 MG capsule Take 1 capsule (100 mg total) by mouth every 8 (eight) hours. 12/07/17   Janne Napoleon, NP  naproxen (NAPROSYN) 500 MG tablet Take 1 tablet (500 mg total) by mouth 2 (two) times daily. 12/07/17   Janne Napoleon, NP    Family History No family history on file.  Social History Social History   Tobacco Use  . Smoking status: Current Every Day Smoker    Types: Cigarettes  . Smokeless tobacco: Never Used  Substance Use Topics  . Alcohol use: No  . Drug use: No     Allergies   Patient has no known allergies.   Review of Systems Review of Systems  Constitutional: Negative for chills and fever.  Gastrointestinal: Negative.   Genitourinary: Positive for discharge and dysuria. Negative for flank pain, frequency, hematuria, penile pain, scrotal swelling, testicular pain and urgency.  Musculoskeletal: Negative.    Physical Exam Updated Vital Signs BP 128/88 (BP Location: Right Arm)   Pulse 71    Temp 98.4 F (36.9 C) (Oral)   Resp 18   Ht 5\' 11"  (1.803 m)   Wt 102.2 kg   SpO2 100%   BMI 31.42 kg/m   Physical Exam  Constitutional: He appears well-developed and well-nourished.  Abdominal: Soft. Normal appearance and bowel sounds are normal. There is no tenderness.  Genitourinary: Testes normal. Right testis shows no swelling and no tenderness. Left testis shows no swelling and no tenderness. Uncircumcised. No penile erythema or penile tenderness. Discharge found.  Genitourinary Comments: Yellow-white discharge leaking from glans. Discharge stains on underwear.  Lymphadenopathy: No inguinal adenopathy noted on the right or left side.  Skin: Skin is warm and intact. Capillary refill takes less than 2 seconds. No rash noted.  Nursing note and vitals reviewed.  ED Treatments / Results  Labs (all labs ordered are listed, but only abnormal results are displayed) Labs Reviewed  WET PREP, GENITAL - Abnormal; Notable for the following components:      Result Value   WBC, Wet Prep HPF POC FEW (*)    All other components within normal limits  URINALYSIS, ROUTINE W REFLEX MICROSCOPIC - Abnormal; Notable for the following components:   Color, Urine STRAW (*)    Leukocytes, UA SMALL (*)    Bacteria, UA RARE (*)    All other components within normal limits  URINE CULTURE  RPR  HIV ANTIBODY (ROUTINE TESTING W REFLEX)  GC/CHLAMYDIA PROBE AMP ([place]) NOT AT [facility]    EKG None  Radiology No  results found.  Procedures Procedures (including critical care time)  Medications Ordered in ED Medications  cefTRIAXone (ROCEPHIN) injection 250 mg (250 mg Intramuscular Given 12/22/17 1332)  azithromycin (ZITHROMAX) tablet 1,000 mg (1,000 mg Oral Given 12/22/17 1332)  sterile water (preservative free) injection (0.9 mLs  Given 12/22/17 1333)     Initial Impression / Assessment and Plan / ED Course  Triage vital signs and the nursing notes have been reviewed.  Pertinent labs &  imaging results that were available during care of the patient were reviewed and considered in medical decision making (see chart for details).  Patient presents to the ED for penile discharge and dysuria which started this morning. Patient is sexually active and admits to unprotected intercourse. He has no groin swelling, skin rashes, genital lesions or other constitutional complaints to suggest a systemic infection. There is no associated pain in scrotum or testicles to suggest an acute process like testicular torsion. Symptoms likely associated with STI, gonorrhea and chlamydia, being the most common. Patient also agrees to HIV and syphilis testing.  Clinical Course as of Dec 23 1507  Mon Dec 22, 2017  1307 UA not suggestive of UTI.   [GM]  1454 Wet prep normal. No findings of trichomonas.   [GM]    Clinical Course User Index [GM] Kamesha Herne, Sharyon Medicus, PA-C   Final Clinical Impressions(s) / ED Diagnoses  1. Penile Discharge. Prophylactically treated for gonorrhea and chlamydia with Rocephin and Zithromax. HIV and syphilis pending. Education around safe sexual practices given.  Dispo: Home. After thorough clinical evaluation, this patient is determined to be medically stable and can be safely discharged with the previously mentioned treatment and/or outpatient follow-up/referral(s). At this time, there are no other apparent medical conditions that require further screening, evaluation or treatment.   Final diagnoses:  Penile discharge    ED Discharge Orders    None        Reva Bores 12/22/17 1510    Gerhard Munch, MD 12/22/17 1630

## 2017-12-22 NOTE — ED Notes (Signed)
This Clinical research associate called lab to check the status of the patient's wet prep, which was sent by this writer at 1158 in the same bag with the GC chlamydia specimen. Lab reported they did not have the wet prep specimen. When asked to check, the lab representative stated the [facility] Chlamydia specimen was signed out to [facility] lab. EDPA notified. Patient re-swabbed and a second specimen was sent to the lab.

## 2017-12-22 NOTE — Discharge Instructions (Addendum)
Your symptoms today are likely from a STI. Today you have been treated for gonorrhea and chlamydia. You do not need to take any additional medication after today.  I recommend that you abstain from sexual activity for at least 1 week to ensure that you do not pass anything to another partner. HIV and syphilis tests take 24-48 hours to result. You will receive a call from our pharmacy if any of those tests are positive.  Thank you for allowing [place] to take care of you today. Please be safe!

## 2017-12-23 LAB — URINE CULTURE: CULTURE: NO GROWTH

## 2017-12-23 LAB — GC/CHLAMYDIA PROBE AMP (~~LOC~~) NOT AT ARMC
Chlamydia: NEGATIVE
Neisseria Gonorrhea: POSITIVE — AB

## 2017-12-23 LAB — RPR: RPR Ser Ql: NONREACTIVE

## 2017-12-23 LAB — HIV ANTIBODY (ROUTINE TESTING W REFLEX): HIV SCREEN 4TH GENERATION: NONREACTIVE

## 2018-01-25 ENCOUNTER — Emergency Department (HOSPITAL_COMMUNITY)
Admission: EM | Admit: 2018-01-25 | Discharge: 2018-01-26 | Disposition: A | Discharge status: Home / Self Care | Payer: Self-pay | Attending: Emergency Medicine | Admitting: Emergency Medicine

## 2018-01-25 ENCOUNTER — Other Ambulatory Visit: Payer: Self-pay

## 2018-01-25 ENCOUNTER — Encounter (HOSPITAL_COMMUNITY): Payer: Self-pay

## 2018-01-25 DIAGNOSIS — F17210 Nicotine dependence, cigarettes, uncomplicated: Secondary | ICD-10-CM | POA: Insufficient documentation

## 2018-01-25 DIAGNOSIS — K529 Noninfective gastroenteritis and colitis, unspecified: Secondary | ICD-10-CM | POA: Insufficient documentation

## 2018-01-25 DIAGNOSIS — R112 Nausea with vomiting, unspecified: Secondary | ICD-10-CM | POA: Insufficient documentation

## 2018-01-25 DIAGNOSIS — R10817 Generalized abdominal tenderness: Secondary | ICD-10-CM | POA: Insufficient documentation

## 2018-01-25 NOTE — ED Triage Notes (Signed)
Pt c/o n/v/d starting last night.  Denies pain.  Pt has not taken anything for symptoms and unsure if he has been around anyone sick.

## 2018-01-26 LAB — URINALYSIS, ROUTINE W REFLEX MICROSCOPIC
BACTERIA UA: NONE SEEN
BILIRUBIN URINE: NEGATIVE
Glucose, UA: NEGATIVE mg/dL
HGB URINE DIPSTICK: NEGATIVE
Ketones, ur: NEGATIVE mg/dL
Nitrite: NEGATIVE
Protein, ur: NEGATIVE mg/dL
SPECIFIC GRAVITY, URINE: 1.023 (ref 1.005–1.030)
pH: 6 (ref 5.0–8.0)

## 2018-01-26 LAB — COMPREHENSIVE METABOLIC PANEL
ALBUMIN: 3.5 g/dL (ref 3.5–5.0)
ALK PHOS: 42 U/L (ref 38–126)
ALT: 24 U/L (ref 0–44)
AST: 24 U/L (ref 15–41)
Anion gap: 6 (ref 5–15)
BUN: 14 mg/dL (ref 6–20)
CHLORIDE: 110 mmol/L (ref 98–111)
CO2: 25 mmol/L (ref 22–32)
CREATININE: 0.98 mg/dL (ref 0.61–1.24)
Calcium: 8.4 mg/dL — ABNORMAL LOW (ref 8.9–10.3)
GFR calc non Af Amer: >60 mL/min (ref >60)
GLUCOSE: 102 mg/dL — AB (ref 70–99)
Potassium: 3.9 mmol/L (ref 3.5–5.1)
SODIUM: 141 mmol/L (ref 135–145)
Total Bilirubin: 0.4 mg/dL (ref 0.3–1.2)
Total Protein: 5.8 g/dL — ABNORMAL LOW (ref 6.5–8.1)

## 2018-01-26 LAB — CBC
HEMATOCRIT: 43.7 % (ref 39.0–52.0)
HEMOGLOBIN: 14.1 g/dL (ref 13.0–17.0)
MCH: 28.1 pg (ref 26.0–34.0)
MCHC: 32.3 g/dL (ref 30.0–36.0)
MCV: 87.2 fL (ref 80.0–100.0)
Platelets: 176 10*3/uL (ref 150–400)
RBC: 5.01 MIL/uL (ref 4.22–5.81)
RDW: 14.1 % (ref 11.5–15.5)
WBC: 7 10*3/uL (ref 4.0–10.5)
nRBC: 0 % (ref 0.0–0.2)

## 2018-01-26 LAB — LIPASE, BLOOD: LIPASE: 28 U/L (ref 11–51)

## 2018-01-26 MED ORDER — PROMETHAZINE HCL 25 MG PO TABS: 25.0000 mg | ORAL_TABLET | Freq: Four times a day (QID) | ORAL | 0 refills | Status: AC | PRN

## 2018-01-26 MED ORDER — ONDANSETRON 8 MG PO TBDP
8.0000 mg | ORAL_TABLET | Freq: Once | ORAL | Status: AC
  Administered 2018-01-26: 8 mg via ORAL
  Filled 2018-01-26: qty 1

## 2018-01-26 NOTE — ED Notes (Signed)
Patient requesting to hold off on IV at this time

## 2018-01-26 NOTE — ED Provider Notes (Signed)
[facility]-EMERGENCY DEPT Provider Note   CSN: [id number] Arrival date & time: 01/25/18  2225    History   Chief Complaint Chief Complaint  Patient presents with  . Emesis  . Diarrhea    HPI Thomas [name] is a 27 y.o. male.   27 year old male with no significant past medical history presents to the emergency department for nausea, vomiting, diarrhea which began last night.  He reports 24 hours of symptoms with multiple episodes of vomiting.  States he has been unable to tolerate food or fluids by mouth.  Diarrhea has been watery, nonbloody.  Complains of associated aching, generalized abdominal discomfort.  No fevers prior to arrival.  Denies hematemesis, urinary symptoms, history of abdominal surgeries.  No medications taken prior to arrival.  Denies any known sick contacts.     History reviewed. No pertinent past medical history.  There are no active problems to display for this patient.   History reviewed. No pertinent surgical history.      Home Medications    Prior to Admission medications   Medication Sig Start Date End Date Taking? Authorizing Provider  benzonatate (TESSALON) 100 MG capsule Take 1 capsule (100 mg total) by mouth every 8 (eight) hours. Patient not taking: Reported on 01/25/2018 12/07/17   Janne Napoleon, NP  naproxen (NAPROSYN) 500 MG tablet Take 1 tablet (500 mg total) by mouth 2 (two) times daily. Patient not taking: Reported on 01/25/2018 12/07/17   Janne Napoleon, NP  promethazine (PHENERGAN) 25 MG tablet Take 1 tablet (25 mg total) by mouth every 6 (six) hours as needed for nausea or vomiting. 01/26/18   Antony Madura, PA-C    Family History History reviewed. No pertinent family history.  Social History Social History   Tobacco Use  . Smoking status: Current Every Day Smoker    Packs/day: 0.50    Types: Cigarettes  . Smokeless tobacco: Never Used  Substance Use Topics  . Alcohol use: No  . Drug use: No      Allergies   Patient has no known allergies.   Review of Systems Review of Systems Ten systems reviewed and are negative for acute change, except as noted in the HPI.    Physical Exam Updated Vital Signs BP 122/86   Pulse (!) 55   Temp 98 F (36.7 C) (Oral)   Resp 16   Ht 6' (1.829 m)   Wt 102.1 kg   SpO2 99%   BMI 30.52 kg/m   Physical Exam  Constitutional: He is oriented to person, place, and time. He appears well-developed and well-nourished. No distress.  Nontoxic appearing and in NAD  HENT:  Head: Normocephalic and atraumatic.  Eyes: Conjunctivae and EOM are normal. No scleral icterus.  Neck: Normal range of motion.  Cardiovascular: Normal rate, regular rhythm and intact distal pulses.  Pulmonary/Chest: Effort normal. No stridor. No respiratory distress.  Abdominal: Soft. He exhibits no mass. There is tenderness (generalized). There is no guarding.  No peritoneal signs.  Musculoskeletal: Normal range of motion.  Neurological: He is alert and oriented to person, place, and time. He exhibits normal muscle tone. Coordination normal.  Skin: Skin is warm and dry. No rash noted. He is not diaphoretic. No erythema. No pallor.  Psychiatric: He has a normal mood and affect. His behavior is normal.  Nursing note and vitals reviewed.    ED Treatments / Results  Labs (all labs ordered are listed, but only abnormal results are displayed) Labs Reviewed  COMPREHENSIVE METABOLIC PANEL - Abnormal; Notable for the following components:      Result Value   Glucose, Bld 102 (*)    Calcium 8.4 (*)    Total Protein 5.8 (*)    All other components within normal limits  URINALYSIS, ROUTINE W REFLEX MICROSCOPIC - Abnormal; Notable for the following components:   Leukocytes, UA TRACE (*)    All other components within normal limits  LIPASE, BLOOD  CBC    EKG None  Radiology No results found.  Procedures Procedures (including critical care time)  Medications  Ordered in ED Medications  ondansetron (ZOFRAN-ODT) disintegrating tablet 8 mg (8 mg Oral Given 01/26/18 0120)     Initial Impression / Assessment and Plan / ED Course  I have reviewed the triage vital signs and the nursing notes.  Pertinent labs & imaging results that were available during my care of the patient were reviewed by me and considered in my medical decision making (see chart for details).     Patient with symptoms consistent with viral gastroenteritis.  Vitals are stable, no fever.  No signs of dehydration, tolerating PO fluids after Zofran.  Lungs are clear.  No focal abdominal pain.  Doubt emergent abdominal etiology such as appendicitis, cholecystitis, pancreatitis, ruptured viscus, UTI, kidney stone, or any other abdominal etiology.  Supportive therapy indicated with return if symptoms worsen.  Return precautions discussed and provided. Patient discharged in stable condition with no unaddressed concerns.   Final Clinical Impressions(s) / ED Diagnoses   Final diagnoses:  Gastroenteritis    ED Discharge Orders         Ordered    promethazine (PHENERGAN) 25 MG tablet  Every 6 hours PRN     01/26/18 0305           Antony Madura, PA-C 01/26/18 0319    Glynn Octave, MD 01/26/18 [phone]

## 2018-01-26 NOTE — Discharge Instructions (Signed)
Avoid fried foods, fatty foods, greasy foods, and milk products until symptoms resolve. Be sure you drink plenty of clear liquids. We recommend the use of Phenergan as prescribed for nausea/vomiting. Follow-up with a primary care doctor to ensure resolution of symptoms.

## 2018-03-20 ENCOUNTER — Encounter (HOSPITAL_COMMUNITY): Payer: Self-pay | Admitting: Emergency Medicine

## 2018-03-20 ENCOUNTER — Emergency Department (HOSPITAL_COMMUNITY)
Admission: EM | Admit: 2018-03-20 | Discharge: 2018-03-20 | Disposition: A | Discharge status: Home / Self Care | Payer: Self-pay | Attending: Emergency Medicine | Admitting: Emergency Medicine

## 2018-03-20 DIAGNOSIS — Z202 Contact with and (suspected) exposure to infections with a predominantly sexual mode of transmission: Secondary | ICD-10-CM | POA: Insufficient documentation

## 2018-03-20 DIAGNOSIS — F17210 Nicotine dependence, cigarettes, uncomplicated: Secondary | ICD-10-CM | POA: Insufficient documentation

## 2018-03-20 DIAGNOSIS — R369 Urethral discharge, unspecified: Secondary | ICD-10-CM | POA: Insufficient documentation

## 2018-03-20 MED ORDER — METRONIDAZOLE 500 MG PO TABS
2000.0000 mg | ORAL_TABLET | Freq: Once | ORAL | Status: AC
  Administered 2018-03-20: 2000 mg via ORAL
  Filled 2018-03-20: qty 4

## 2018-03-20 NOTE — Discharge Instructions (Signed)
No sex for 1 week. Use a condom with every sexual encounter Follow up with health department if symptoms persist.  Please return to the ER for fevers, vomiting, new or worsening symptoms, any additional concerns.   You have been tested for chlamydia and gonorrhea. These results will be available in approximately 3 days. You will be notified if they are positive.

## 2018-03-20 NOTE — ED Provider Notes (Addendum)
Thomas [name]   CSN: [id number] Arrival date & time: 03/20/18  [name]     History   Chief Complaint Chief Complaint  Patient presents with  . Exposure to STD    HPI Thomas [name] is a 27 y.o. [name].  The history is provided by the patient and medical records. No language interpreter was used.  Exposure to STD  Pertinent negatives include no abdominal pain.   Thomas [name] is an otherwise healthy 27 year old [name] who presents to the emergency department after his girlfriend told him that she tested positive for trichomonas.  He does state that he developed penile discharge this morning upon awakening.  Denies any urinary symptoms.  No fever, chills, abdominal pain, nausea or vomiting.  Denies any GU lesions or scrotal pain/swelling.   History reviewed. No pertinent past medical history.  There are no active problems to display for this patient.   History reviewed. No pertinent surgical history.      Home Medications    Prior to Admission medications   Medication Sig Start Date End Date Taking? Authorizing Provider  benzonatate (TESSALON) 100 MG capsule Take 1 capsule (100 mg total) by mouth every 8 (eight) hours. Patient not taking: Reported on 01/25/2018 12/07/17   Janne NapoleonNeese, Hope M, NP  naproxen (NAPROSYN) 500 MG tablet Take 1 tablet (500 mg total) by mouth 2 (two) times daily. Patient not taking: Reported on 01/25/2018 12/07/17   Janne NapoleonNeese, Hope M, NP  promethazine (PHENERGAN) 25 MG tablet Take 1 tablet (25 mg total) by mouth every 6 (six) hours as needed for nausea or vomiting. 01/26/18   Antony MaduraHumes, Kelly, PA-C    Family History No family history on file.  Social History Social History   Tobacco Use  . Smoking status: Current Every Day Smoker    Packs/day: 0.50    Types: Cigarettes  . Smokeless tobacco: Never Used  Substance Use Topics  . Alcohol use: No  . Drug use: No     Allergies   Patient has no known  allergies.   Review of Systems Review of Systems  Constitutional: Negative for chills and fever.  Gastrointestinal: Negative for abdominal pain, diarrhea, nausea and vomiting.  Genitourinary: Positive for discharge. Negative for difficulty urinating, dysuria, flank pain, frequency, genital sores, penile pain, penile swelling, scrotal swelling, testicular pain and urgency.  Musculoskeletal: Negative for back pain.     Physical Exam Updated Vital Signs BP (!) 147/110 (BP Location: Left Arm)   Pulse 72   Temp 98.3 F (36.8 C) (Oral)   Resp 17   Ht 6' (1.829 m)   Wt 103.9 kg   SpO2 100%   BMI 31.06 kg/m   Physical Exam Vitals signs and nursing [name] reviewed.  Constitutional:      General: He is not in acute distress.    Appearance: He is well-developed.  HENT:     Head: Normocephalic and atraumatic.  Cardiovascular:     Rate and Rhythm: Normal rate and regular rhythm.     Heart sounds: Normal heart sounds. No murmur.  Pulmonary:     Effort: Pulmonary effort is normal. No respiratory distress.     Breath sounds: Normal breath sounds.  Abdominal:     General: There is no distension.     Palpations: Abdomen is soft.     Comments: No abdominal tenderness.  Genitourinary:    Comments: Chaperone present for exam. No discharge from penis. No signs of lesion or erythema on the penis  or testicles. The penis and testicles are nontender. No testicular masses or swelling. Skin:    General: Skin is warm and dry.  Neurological:     Mental Status: He is alert and oriented to person, place, and time.      ED Treatments / Results  Labs (all labs ordered are listed, but only abnormal results are displayed) Labs Reviewed  GC[facility]AM[facility]IA [facility]BE AMP [facility]    EKG None  Radiology No results found.  Procedures Procedures (including critical care time)  Medications Ordered in ED Medications  metroNIDAZOLE (FLAGYL) tablet 2,000 mg (has no administration in  time range)     Initial Impression / Assessment and Plan / ED Course  I have reviewed the triage vital signs and the nursing notes.  Pertinent labs & imaging results that were available during my care of the patient were reviewed by me and considered in my medical decision making (see chart for details).    Thomas [name] is a 27 y.o. [name] who presents to ED for penile discharge which began this morning.  Girlfriend told him yesterday that she had trichomonas and he needed to get treated.  Benign abdominal and GU exam.  Given Flagyl in ED.  UA sent for gonorrhea and chlamydia.  Follow-up with health department if symptoms persist.  Reasons to return to the ER and all questions answered.  Addendum: Recheck BP at discharge still elevated. Patient states his BP is always high when he is stressed. Denies hx of HTN. Patient is currently asymptomatic. No chest pain, diaphoresis, nausea or other ACS symptoms. No headache or neurologic complaints. No change in urine output. Normal cardiopulmonary exam. Intact and equal distal pulses. Patient understands importance of close PCP follow up for BP recheck. Reasons to return to ER were discussed and all questions answered.    Final Clinical Impressions(s) / ED Diagnoses   Final diagnoses:  STD exposure  Penile discharge    ED Discharge Orders    None       Shaynah Hund, Thomas PicketJaime Pilcher, PA-C 03/20/18 [id number]    Kaoru Benda, Thomas PicketJaime Pilcher, PA-C 03/20/18 [id number]    Maia PlanLong, Thomas [name], Thomas [name] 03/20/18 1840

## 2018-03-20 NOTE — ED Notes (Signed)
PA notified of pts high blood pressure.  This RN provided pt education about need to follow up with PCP for blood pressure control, importance of maintaining low blood pressure.  Pt verbalized understanding of risk of high blood pressure, need for PCP follow up.

## 2018-03-20 NOTE — ED Triage Notes (Signed)
Pt reports that his partner told him that she was positive for Trich and he needed to be treated. Reports clearish penial discharge in the mornings.

## 2018-03-23 LAB — GC/CHLAMYDIA PROBE AMP (~~LOC~~) NOT AT ARMC
Chlamydia: POSITIVE — AB
Neisseria Gonorrhea: NEGATIVE

## 2018-03-27 ENCOUNTER — Encounter (HOSPITAL_COMMUNITY): Payer: Self-pay

## 2018-03-27 ENCOUNTER — Emergency Department (HOSPITAL_COMMUNITY)
Admission: EM | Admit: 2018-03-27 | Discharge: 2018-03-27 | Disposition: A | Discharge status: Home / Self Care | Payer: Self-pay | Attending: Emergency Medicine | Admitting: Emergency Medicine

## 2018-03-27 ENCOUNTER — Other Ambulatory Visit: Payer: Self-pay

## 2018-03-27 DIAGNOSIS — F17210 Nicotine dependence, cigarettes, uncomplicated: Secondary | ICD-10-CM | POA: Insufficient documentation

## 2018-03-27 DIAGNOSIS — Z79899 Other long term (current) drug therapy: Secondary | ICD-10-CM | POA: Insufficient documentation

## 2018-03-27 DIAGNOSIS — A749 Chlamydial infection, unspecified: Secondary | ICD-10-CM | POA: Insufficient documentation

## 2018-03-27 MED ORDER — STERILE WATER FOR INJECTION IJ SOLN
INTRAMUSCULAR | Status: AC
  Administered 2018-03-27: 10 mL
  Filled 2018-03-27: qty 10

## 2018-03-27 MED ORDER — CEFTRIAXONE SODIUM 250 MG IJ SOLR
250.0000 mg | Freq: Once | INTRAMUSCULAR | Status: AC
  Administered 2018-03-27: 250 mg via INTRAMUSCULAR
  Filled 2018-03-27: qty 250

## 2018-03-27 MED ORDER — AZITHROMYCIN 250 MG PO TABS
1000.0000 mg | ORAL_TABLET | Freq: Once | ORAL | Status: AC
  Administered 2018-03-27: 1000 mg via ORAL
  Filled 2018-03-27: qty 4

## 2018-03-27 NOTE — ED Provider Notes (Signed)
[facility]-EMERGENCY DEPT Provider Note   CSN: [id number] Arrival date & time: 03/27/18  1450     History   Chief Complaint Chief Complaint  Patient presents with  . Penile Discharge    HPI Thomas [name] is a 28 y.o. male.  HPI   28 year old male presents requesting treatment for chlamydia.  Patient states he was seen 3 for penile discharge.  He states he was diagnosed with trichomonas and treated.  He states he noticed on his electronic medical record that he also tested positive for chlamydia.  He notes that his penile discharge has persisted.  He denies any abdominal pain, fevers, chills, penile pain, dysuria, lesions on the penis or testicles.   History reviewed. No pertinent past medical history.  There are no active problems to display for this patient.   History reviewed. No pertinent surgical history.      Home Medications    Prior to Admission medications   Medication Sig Start Date End Date Taking? Authorizing Provider  benzonatate (TESSALON) 100 MG capsule Take 1 capsule (100 mg total) by mouth every 8 (eight) hours. Patient not taking: Reported on 01/25/2018 12/07/17   Janne Napoleon, NP  naproxen (NAPROSYN) 500 MG tablet Take 1 tablet (500 mg total) by mouth 2 (two) times daily. Patient not taking: Reported on 01/25/2018 12/07/17   Janne Napoleon, NP  promethazine (PHENERGAN) 25 MG tablet Take 1 tablet (25 mg total) by mouth every 6 (six) hours as needed for nausea or vomiting. 01/26/18   Antony Madura, PA-C    Family History Family History  Problem Relation Age of Onset  . Hypertension Mother     Social History Social History   Tobacco Use  . Smoking status: Current Every Day Smoker    Packs/day: 0.50    Types: Cigarettes  . Smokeless tobacco: Never Used  Substance Use Topics  . Alcohol use: No  . Drug use: No     Allergies   Patient has no known allergies.   Review of Systems Review of Systems  Constitutional: Negative  for chills and fever.  Respiratory: Negative for shortness of breath.   Cardiovascular: Negative for chest pain.  Gastrointestinal: Negative for abdominal pain, nausea and vomiting.  Genitourinary: Positive for discharge. Negative for penile pain, penile swelling, scrotal swelling and testicular pain.  Skin: Negative for rash.     Physical Exam Updated Vital Signs BP (!) 165/108 (BP Location: Left Arm)   Pulse 91   Temp 98.3 F (36.8 C) (Oral)   Resp 16   Ht 6' (1.829 m)   Wt 103.2 kg   SpO2 100%   BMI 30.86 kg/m   Physical Exam Vitals signs and nursing note reviewed.  Constitutional:      Appearance: He is well-developed.  HENT:     Head: Normocephalic and atraumatic.  Eyes:     Conjunctiva/sclera: Conjunctivae normal.  Neck:     Musculoskeletal: Neck supple.  Cardiovascular:     Rate and Rhythm: Normal rate and regular rhythm.     Heart sounds: Normal heart sounds. No murmur.  Pulmonary:     Effort: Pulmonary effort is normal. No respiratory distress.     Breath sounds: Normal breath sounds. No wheezing or rales.  Abdominal:     General: Bowel sounds are normal. There is no distension.     Palpations: Abdomen is soft.     Tenderness: There is no abdominal tenderness.  Genitourinary:    Pubic Area: No  rash.      Penis: Discharge (yellow) present. No phimosis, paraphimosis, erythema or tenderness.      Scrotum/Testes: Normal.  Musculoskeletal: Normal range of motion.        General: No tenderness or deformity.  Skin:    General: Skin is warm and dry.     Findings: No erythema or rash.  Neurological:     Mental Status: He is alert and oriented to person, place, and time.  Psychiatric:        Behavior: Behavior normal.      ED Treatments / Results  Labs (all labs ordered are listed, but only abnormal results are displayed) Labs Reviewed - No data to display  EKG None  Radiology No results found.  Procedures Procedures (including critical care  time)  Medications Ordered in ED Medications  azithromycin (ZITHROMAX) tablet 1,000 mg (1,000 mg Oral Given 03/27/18 1531)  cefTRIAXone (ROCEPHIN) injection 250 mg (250 mg Intramuscular Given 03/27/18 1532)  sterile water (preservative free) injection (10 mLs  Given 03/27/18 1532)     Initial Impression / Assessment and Plan / ED Course  I have reviewed the triage vital signs and the nursing notes.  Pertinent labs & imaging results that were available during my care of the patient were reviewed by me and considered in my medical decision making (see chart for details).     Patient presents today requesting treatment for chlamydia.  He is resting comfortably in bed, no acute distress, nontoxic, non-lethargic.  Patient slightly hypertensive which has been noted in his chart multiple times.  He states he has not followed up with primary care regarding his hypertension.  Wise vital signs stable.  Reviewing his EMR shows that patient tested positive for chlamydia on 12/27 but did not seek treatment.  We will treat today.  Physical exam shows penile discharge but no other acute findings.  No evidence of paraphimosis, phimosis, herpes.  Patient ready and stable for discharge.  Final Clinical Impressions(s) / ED Diagnoses   Final diagnoses:  None    ED Discharge Orders    None       Rueben BashKendrick, Iana Buzan S, PA-C 03/27/18 2149    Donnetta Hutchingook, Brian, MD 03/28/18 2053

## 2018-03-27 NOTE — Discharge Instructions (Signed)
Return to the ED immediately for new or worsening symptoms, such as fevers, abdominal pain, vomiting, chest pain, shortness of breath or any concerns at all.

## 2018-03-27 NOTE — ED Triage Notes (Signed)
Patient reports that he saw on his my chart that he was positive for chlamydia. Patient reports that he is having a small amount of penile discharge x weeks.
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
## 2019-03-28 ENCOUNTER — Emergency Department (HOSPITAL_COMMUNITY)
Admission: EM | Admit: 2019-03-28 | Discharge: 2019-03-28 | Disposition: A | Discharge status: Home / Self Care | Payer: Self-pay | Attending: Emergency Medicine | Admitting: Emergency Medicine

## 2019-03-28 ENCOUNTER — Encounter (HOSPITAL_COMMUNITY): Payer: Self-pay | Admitting: Emergency Medicine

## 2019-03-28 ENCOUNTER — Other Ambulatory Visit: Payer: Self-pay

## 2019-03-28 DIAGNOSIS — L0201 Cutaneous abscess of face: Secondary | ICD-10-CM | POA: Insufficient documentation

## 2019-03-28 DIAGNOSIS — Z5321 Procedure and treatment not carried out due to patient leaving prior to being seen by health care provider: Secondary | ICD-10-CM | POA: Insufficient documentation

## 2019-03-28 LAB — URINALYSIS, ROUTINE W REFLEX MICROSCOPIC
Bilirubin Urine: NEGATIVE
Glucose, UA: NEGATIVE mg/dL
Hgb urine dipstick: NEGATIVE
Ketones, ur: NEGATIVE mg/dL
Leukocytes,Ua: NEGATIVE
Nitrite: NEGATIVE
Protein, ur: NEGATIVE mg/dL
Specific Gravity, Urine: 1.019 (ref 1.005–1.030)
pH: 6 (ref 5.0–8.0)

## 2019-03-28 NOTE — ED Triage Notes (Signed)
Per pt, states abscess on left check and states he needs to be checked for a STD-symptoms started 3 days ago
